# Patient Record
Sex: Female | Born: 1950
Health system: Southern US, Community
[De-identification: ages and names within clinical notes are randomized; demographics above are authoritative.]

## PROBLEM LIST (undated history)

## (undated) DIAGNOSIS — M1990 Unspecified osteoarthritis, unspecified site: Secondary | ICD-10-CM

## (undated) HISTORY — DX: Unspecified osteoarthritis, unspecified site: M19.90

---

## 2004-09-28 ENCOUNTER — Ambulatory Visit: Payer: Self-pay | Admitting: Internal Medicine

## 2004-09-28 ENCOUNTER — Other Ambulatory Visit: Admission: RE | Admit: 2004-09-28 | Discharge: 2004-09-28 | Payer: Self-pay | Admitting: Internal Medicine

## 2004-09-28 LAB — CONVERTED CEMR LAB: Pap Smear: NORMAL

## 2004-10-10 ENCOUNTER — Ambulatory Visit: Payer: Self-pay | Admitting: Internal Medicine

## 2004-11-17 ENCOUNTER — Ambulatory Visit: Payer: Self-pay | Admitting: Internal Medicine

## 2006-08-26 ENCOUNTER — Encounter: Payer: Self-pay | Admitting: Internal Medicine

## 2006-08-29 ENCOUNTER — Ambulatory Visit: Payer: Self-pay | Admitting: Internal Medicine

## 2006-08-29 ENCOUNTER — Other Ambulatory Visit: Admission: RE | Admit: 2006-08-29 | Discharge: 2006-08-29 | Payer: Self-pay | Admitting: Internal Medicine

## 2006-08-29 ENCOUNTER — Encounter: Payer: Self-pay | Admitting: Internal Medicine

## 2006-08-29 DIAGNOSIS — M19042 Primary osteoarthritis, left hand: Secondary | ICD-10-CM

## 2006-08-29 DIAGNOSIS — M19041 Primary osteoarthritis, right hand: Secondary | ICD-10-CM

## 2006-09-05 ENCOUNTER — Encounter (INDEPENDENT_AMBULATORY_CARE_PROVIDER_SITE_OTHER): Payer: Self-pay | Admitting: *Deleted

## 2006-09-11 ENCOUNTER — Ambulatory Visit: Payer: Self-pay | Admitting: Internal Medicine

## 2006-09-11 ENCOUNTER — Encounter: Payer: Self-pay | Admitting: Internal Medicine

## 2006-09-16 ENCOUNTER — Encounter (INDEPENDENT_AMBULATORY_CARE_PROVIDER_SITE_OTHER): Payer: Self-pay | Admitting: *Deleted

## 2006-10-21 ENCOUNTER — Encounter: Payer: Self-pay | Admitting: Internal Medicine

## 2006-12-06 ENCOUNTER — Encounter: Payer: Self-pay | Admitting: Internal Medicine

## 2006-12-06 ENCOUNTER — Ambulatory Visit: Payer: Self-pay | Admitting: Unknown Physician Specialty

## 2006-12-06 LAB — HM COLONOSCOPY

## 2009-08-26 ENCOUNTER — Ambulatory Visit: Payer: Self-pay | Admitting: Internal Medicine

## 2009-08-26 ENCOUNTER — Other Ambulatory Visit: Admission: RE | Admit: 2009-08-26 | Discharge: 2009-08-26 | Payer: Self-pay | Admitting: Internal Medicine

## 2009-08-26 DIAGNOSIS — J019 Acute sinusitis, unspecified: Secondary | ICD-10-CM

## 2009-08-29 LAB — CONVERTED CEMR LAB
Alkaline Phosphatase: 91 units/L (ref 39–117)
BUN: 16 mg/dL (ref 6–23)
Basophils Relative: 0.8 % (ref 0.0–3.0)
Bilirubin, Direct: 0.1 mg/dL (ref 0.0–0.3)
CO2: 29 meq/L (ref 19–32)
Calcium: 9.2 mg/dL (ref 8.4–10.5)
Eosinophils Absolute: 0.1 10*3/uL (ref 0.0–0.7)
Eosinophils Relative: 1.1 % (ref 0.0–5.0)
Lymphocytes Relative: 26.2 % (ref 12.0–46.0)
MCHC: 34.1 g/dL (ref 30.0–36.0)
Neutrophils Relative %: 64.8 % (ref 43.0–77.0)
Phosphorus: 3.3 mg/dL (ref 2.3–4.6)
Platelets: 232 10*3/uL (ref 150.0–400.0)
Potassium: 4.2 meq/L (ref 3.5–5.1)
RBC: 4.09 M/uL (ref 3.87–5.11)
Sodium: 142 meq/L (ref 135–145)
Total Bilirubin: 0.6 mg/dL (ref 0.3–1.2)
Total Protein: 7.3 g/dL (ref 6.0–8.3)
WBC: 7.5 10*3/uL (ref 4.5–10.5)

## 2009-08-30 ENCOUNTER — Encounter: Payer: Self-pay | Admitting: Internal Medicine

## 2009-09-01 ENCOUNTER — Encounter: Payer: Self-pay | Admitting: Internal Medicine

## 2009-09-01 ENCOUNTER — Ambulatory Visit: Payer: Self-pay | Admitting: Internal Medicine

## 2009-09-05 ENCOUNTER — Encounter: Payer: Self-pay | Admitting: Internal Medicine

## 2009-09-05 LAB — HM MAMMOGRAPHY: HM Mammogram: NORMAL

## 2010-04-18 NOTE — Assessment & Plan Note (Signed)
Summary: cpx with pap/rbh   Vital Signs:  Patient profile:   60 year old female Height:      61 inches Weight:      122 pounds BMI:     23.14 Temp:     98.5 degrees F oral Pulse rate:   60 / minute Pulse rhythm:   regular BP sitting:   120 / 62  (left arm) Cuff size:   regular  Vitals Entered By: Mervin Hack CMA Duncan Dull) (August 26, 2009 8:41 AM) CC: adult physical with pap   History of Present Illness: Has had some sinus problems over the past month sore throat and ear pain takes benedryl and sudafed with some help Thinks it may be related to cold air blowing at work Had fever and chills several weeks ago seems to feel better then worsens again    Allergies: 1)  Penicillin V Potassium (Penicillin V Potassium)  Past History:  Past medical, surgical, family and social histories (including risk factors) reviewed for relevance to current acute and chronic problems.  Past Medical History: Reviewed history from 08/29/2006 and no changes required.  Osteoarthritis  Past Surgical History: Reviewed history from 08/26/2006 and no changes required. vaginal delivery x 2  Family History: Reviewed history from 08/26/2006 and no changes required. Father: Died with lung cancer Mother: Died at age 58 heart  Siblings: 1 brother drinks  2 sisters ETOH- parents DM- ? Mat GM Breast cancer- Mat. aunt  Social History: Reviewed history from 08/29/2006 and no changes required. 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 Eyes:  Denies double vision and vision loss-1 eye. ENT:  Complains of sinus pressure and sore throat; denies decreased hearing and ringing in ears; teeth okay---regular with dentist. CV:  Denies chest pain or discomfort, difficulty breathing at  night, difficulty breathing while lying down, fainting, lightheadness, palpitations, and shortness of breath with exertion. Resp:  Complains of cough; denies shortness of breath; some cough with sinus stuff in past month. GI:  Denies abdominal pain, bloody stools, change in bowel habits, dark tarry stools, indigestion, nausea, and vomiting. GU:  Denies dysuria and incontinence; no sexual problems no self breast exam. MS:  Complains of joint pain; denies joint swelling; mild joint pains--no meds. Derm:  Denies lesion(s) and rash. Neuro:  Complains of headaches and numbness; denies tingling and weakness; occ numbness in arms at night---gets better quickly better with "side sleeper" thing Occ mild headaches. Psych:  Denies anxiety and depression. Heme:  Denies abnormal bruising and enlarge lymph nodes. Allergy:  Denies seasonal allergies and sneezing.  Physical Exam  General:  alert and normal appearance.   Head:  no frontal or maxillary tenderness Eyes:  pupils equal, pupils round, pupils reactive to light, and no optic disk abnormalities.   Ears:  R ear normal and L ear normal.   Nose:  moderate congestion mild inflammation Mouth:  no erythema, no exudates, and no lesions.   Neck:  supple, no masses, no thyromegaly, and no cervical lymphadenopathy.   Breasts:  skin/areolae normal, no masses, no abnormal thickening, no tenderness, and no adenopathy.   Lungs:  normal respiratory effort and normal breath sounds.   Heart:  normal rate, regular rhythm, no murmur, and no gallop.   Abdomen:  soft, non-tender, and no masses.   Genitalia:  normal introitus, no external lesions, no vaginal discharge, mucosa pink and moist, no vaginal or cervical lesions, no friaility or hemorrhage, normal uterus size and position, and no adnexal masses or tenderness.  Uterus retroverted Msk:  no joint tenderness and no joint swelling.   Pulses:  normal in feet Extremities:  no edema Neurologic:  alert & oriented  X3, strength normal in all extremities, and gait normal.   Skin:  no rashes and no suspicious lesions.   Psych:  normally interactive, good eye contact, not anxious appearing, and not depressed appearing.     Impression & Recommendations:  Problem # 1:  HEALTH MAINTENANCE EXAM (ICD-V70.0) Assessment Comment Only healthy counselling done due for mammo  check glucose  Problem # 2:  SINUSITIS - ACUTE-NOS (ICD-461.9) Assessment: New  seems like mild infection will treat with z-pak (pcn allergic)  Her updated medication list for this problem includes:    Azithromycin 250 Mg Tabs (Azithromycin) .Marland Kitchen... 2 tabs today then 1 tab daily for the next 4 days for sinus infection  Orders: TLB-CBC Platelet - w/Differential (85025-CBCD) TLB-Hepatic/Liver Function Pnl (80076-HEPATIC) TLB-TSH (Thyroid Stimulating Hormone) (84443-TSH) TLB-Renal Function Panel (80069-RENAL) Venipuncture ([address])  Problem # 3:  OSTEOARTHRITIS (ICD-715.90) Assessment: Unchanged mild  no meds needed  Complete Medication List: 1)  Azithromycin 250 Mg Tabs (Azithromycin) .... 2 tabs today then 1 tab daily for the next 4 days for sinus infection  Other Orders: Radiology Referral (Radiology)  Patient Instructions: 1)  Please schedule a follow-up appointment in 1 year.  2)  Schedule your mammogram.  Prescriptions: AZITHROMYCIN 250 MG TABS (AZITHROMYCIN) 2 tabs today then 1 tab daily for the next 4 days for sinus infection  #6 x 0   Entered and Authorized by:   Cindee Salt MD   Signed by:   Cindee Salt MD on 08/26/2009   Method used:   Electronically to        [facility]  [address] [phone]* (retail)       [address]       [place], [place]  [address]       Ph: [phone]       Fax: [phone]   RxID:   [id number]   Prior Medications: Current Allergies (reviewed today): PENICILLIN V POTASSIUM (PENICILLIN V POTASSIUM)  Appended Document: Orders Update    Clinical Lists  Changes  Orders: Added new Service order of Est. Patient Level III (02725) - Signed

## 2010-04-18 NOTE — Letter (Signed)
Summary: Results Follow up Letter  [place] [facility]  [address] [place], [place] [address]   Phone: [phone]  Fax: [phone]    08/30/2009 MRN: [id number]  [facility] [address] [place], [place]  [address]  Dear Jill Yang,  The following are the results of your recent test(s):  Test         Result    Pap Smear:        Normal __X___  Not Normal _____ Comments: Pap is normal ______________________________________________________ Cholesterol: LDL(Bad cholesterol):         Your goal is less than:         HDL (Good cholesterol):       Your goal is more than: Comments:  ______________________________________________________ Mammogram:        Normal _____  Not Normal _____ Comments:  ___________________________________________________________________ Hemoccult:        Normal _____  Not normal _______ Comments:    _____________________________________________________________________ Other Tests:    We routinely do not discuss normal results over the telephone.  If you desire a copy of the results, or you have any questions about this information we can discuss them at your next office visit.   Sincerely,      Tillman Abide, MD

## 2010-04-18 NOTE — Letter (Signed)
Summary: Results Follow up Letter  [place] at [facility]  [address] [place], [place] [address]   Phone: [phone]  Fax: [phone]    09/05/2009 MRN: [id number]  [facility] [address] [place], [place]  [address]  Dear Ms. Mcfann,  The following are the results of your recent test(s):  Test         Result    Pap Smear:        Normal _____  Not Normal _____ Comments: ______________________________________________________ Cholesterol: LDL(Bad cholesterol):         Your goal is less than:         HDL (Good cholesterol):       Your goal is more than: Comments:  ______________________________________________________ Mammogram:        Normal __X___  Not Normal _____ Comments: mammo is fine, repeat recommended in 1-2 years.  ___________________________________________________________________ Hemoccult:        Normal _____  Not normal _______ Comments:    _____________________________________________________________________ Other Tests:    We routinely do not discuss normal results over the telephone.  If you desire a copy of the results, or you have any questions about this information we can discuss them at your next office visit.   Sincerely,      Tillman Abide, MD

## 2011-01-12 ENCOUNTER — Encounter: Payer: Self-pay | Admitting: Internal Medicine

## 2011-01-15 ENCOUNTER — Ambulatory Visit (INDEPENDENT_AMBULATORY_CARE_PROVIDER_SITE_OTHER): Payer: Self-pay | Admitting: Internal Medicine

## 2011-01-15 ENCOUNTER — Encounter: Payer: Self-pay | Admitting: Internal Medicine

## 2011-01-15 VITALS — BP 144/73 | HR 69 | Temp 98.6°F | Ht 61.0 in | Wt 124.0 lb

## 2011-01-15 DIAGNOSIS — M1990 Unspecified osteoarthritis, unspecified site: Secondary | ICD-10-CM

## 2011-01-15 DIAGNOSIS — Z1231 Encounter for screening mammogram for malignant neoplasm of breast: Secondary | ICD-10-CM

## 2011-01-15 DIAGNOSIS — Z0000 Encounter for general adult medical examination without abnormal findings: Secondary | ICD-10-CM

## 2011-01-15 NOTE — Assessment & Plan Note (Signed)
Various pains Not reasonable for work to expect her to push cart weighing over 700# She will review with supervisors  Gets relief with [employer]

## 2011-01-15 NOTE — Progress Notes (Signed)
Subjective:    Patient ID: Jill Yang, female    DOB: 04-05-1950, 60 y.o.   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 Feels that will worsen her condition---has to push and pull heavy cart and turn crank Doesn't feel she could handle the heaviness of the instruments Will have to work 2nd shift at times also  Nothing new in social or family histories  No current outpatient prescriptions on file prior to visit.    Allergies  Allergen Reactions  . Penicillins     Past Medical History  Diagnosis Date  . Arthritis     No past surgical history on file.  Family History  Problem Relation Age of Onset  . Alcohol abuse Mother   . Alcohol abuse Father   . Diabetes Maternal Aunt   . Cancer Maternal Aunt     breast cancer    History   Social History  . Marital Status: Married    Spouse Name: N/A    Number of Children: 2  . Years of Education: N/A   Occupational History  . 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 Smoking status: Never Smoker   . Smokeless tobacco: Never Used  . Alcohol Use: No  . Drug Use: No  . Sexually Active: Not on file   Other Topics Concern  . Not on file   Social History Narrative  . No narrative on file   Review of Systems  Constitutional: Negative for fatigue and unexpected weight change.       Wears seat belt  HENT: Positive for congestion and rhinorrhea. Negative for hearing loss, dental problem and tinnitus.        Occ allergy symptoms---does okay with OTC antihistamines  Eyes: Negative for visual disturbance.       No diplopia or unilateral vision loss  Respiratory: Negative for cough, chest tightness and shortness of breath.   Cardiovascular: Negative for chest pain, palpitations and leg swelling.  Gastrointestinal: Positive for anal  bleeding. Negative for nausea, vomiting, abdominal pain, constipation and blood in stool.       No heartburn occ blood on toilet paper from hemorrhoids  Genitourinary: Negative for dysuria, frequency and difficulty urinating.       No incontinence No sexual problems  Musculoskeletal: Positive for back pain and arthralgias. Negative for joint swelling.       Aleve helps pain-- at most 4 per days  Skin: Negative for color change and rash.  Neurological: Positive for numbness and headaches. Negative for dizziness, syncope and light-headedness.       Occ headaches--aleve aborts occ left arm numbness  Hematological: Negative for adenopathy. Does not bruise/bleed easily.  Psychiatric/Behavioral: Positive for sleep disturbance. Negative for dysphoric mood. The patient is not nervous/anxious.        Some sleep problems from right arm pain and occ numbness       Objective:   Physical Exam  Constitutional: She is oriented to person, place, and time. She appears well-developed and well-nourished. No distress.  HENT:  Head: Normocephalic and atraumatic.  Right Ear: External ear normal.  Left Ear: External ear normal.  Mouth/Throat: Oropharynx is clear and moist. No oropharyngeal exudate.       TMs normal  Eyes: Conjunctivae and EOM are normal. Pupils are equal, round, and reactive to  light.       Fundi benign  Neck: Normal range of motion. Neck supple. No thyromegaly present.  Cardiovascular: Normal rate, regular rhythm, normal heart sounds and intact distal pulses.  Exam reveals no gallop.   No murmur heard. Pulmonary/Chest: Effort normal and breath sounds normal. No respiratory distress. She has no wheezes. She has no rales.  Abdominal: Soft. There is no tenderness.  Genitourinary:       Mild cystic changes in breasts R>L  Musculoskeletal: Normal range of motion. She exhibits no edema and no tenderness.  Lymphadenopathy:    She has no cervical adenopathy.    She has no axillary  adenopathy.  Neurological: She is alert and oriented to person, place, and time.  Skin: Skin is warm. No rash noted.  Psychiatric: She has a normal mood and affect. Her behavior is normal. Judgment and thought content normal.          Assessment & Plan:

## 2011-01-15 NOTE — Assessment & Plan Note (Signed)
Healthy Discussed fitness Will schedule mammo Check glucose

## 2011-01-16 LAB — GLUCOSE, RANDOM: Glucose, Bld: 104 mg/dL — ABNORMAL HIGH (ref 70–99)

## 2011-03-08 ENCOUNTER — Ambulatory Visit: Payer: Self-pay | Admitting: Internal Medicine

## 2011-03-12 ENCOUNTER — Encounter: Payer: Self-pay | Admitting: *Deleted

## 2014-07-09 ENCOUNTER — Ambulatory Visit (INDEPENDENT_AMBULATORY_CARE_PROVIDER_SITE_OTHER): Payer: 59 | Admitting: Internal Medicine

## 2014-07-09 ENCOUNTER — Encounter: Payer: Self-pay | Admitting: Internal Medicine

## 2014-07-09 ENCOUNTER — Other Ambulatory Visit (HOSPITAL_COMMUNITY)
Admission: RE | Admit: 2014-07-09 | Discharge: 2014-07-09 | Disposition: A | Discharge status: Home / Self Care | Payer: 59 | Source: Ambulatory Visit | Attending: Internal Medicine | Admitting: Internal Medicine

## 2014-07-09 ENCOUNTER — Encounter (INDEPENDENT_AMBULATORY_CARE_PROVIDER_SITE_OTHER): Payer: Self-pay

## 2014-07-09 VITALS — BP 138/70 | HR 65 | Temp 97.9°F | Ht 61.0 in | Wt 127.0 lb

## 2014-07-09 DIAGNOSIS — Z1151 Encounter for screening for human papillomavirus (HPV): Secondary | ICD-10-CM | POA: Insufficient documentation

## 2014-07-09 DIAGNOSIS — M19042 Primary osteoarthritis, left hand: Secondary | ICD-10-CM

## 2014-07-09 DIAGNOSIS — M19041 Primary osteoarthritis, right hand: Secondary | ICD-10-CM | POA: Diagnosis not present

## 2014-07-09 DIAGNOSIS — Z01419 Encounter for gynecological examination (general) (routine) without abnormal findings: Secondary | ICD-10-CM | POA: Insufficient documentation

## 2014-07-09 DIAGNOSIS — Z23 Encounter for immunization: Secondary | ICD-10-CM | POA: Diagnosis not present

## 2014-07-09 DIAGNOSIS — Z0000 Encounter for general adult medical examination without abnormal findings: Secondary | ICD-10-CM

## 2014-07-09 LAB — RENAL FUNCTION PANEL
Albumin: 4.3 g/dL (ref 3.5–5.2)
BUN: 18 mg/dL (ref 6–23)
CALCIUM: 9.3 mg/dL (ref 8.4–10.5)
CO2: 28 mEq/L (ref 19–32)
Chloride: 107 mEq/L (ref 96–112)
Creatinine, Ser: 0.71 mg/dL (ref 0.40–1.20)
GFR: 88.46 mL/min (ref >60.00)
Glucose, Bld: 98 mg/dL (ref 70–99)
PHOSPHORUS: 3.5 mg/dL (ref 2.3–4.6)
POTASSIUM: 3.9 meq/L (ref 3.5–5.1)
SODIUM: 141 meq/L (ref 135–145)

## 2014-07-09 LAB — LIPID PANEL
Cholesterol: 219 mg/dL — ABNORMAL HIGH (ref 0–200)
HDL: 67 mg/dL (ref >39.00)
LDL Cholesterol: 136 mg/dL — ABNORMAL HIGH (ref 0–99)
NONHDL: 152
TRIGLYCERIDES: 79 mg/dL (ref 0.0–149.0)
Total CHOL/HDL Ratio: 3
VLDL: 15.8 mg/dL (ref 0.0–40.0)

## 2014-07-09 NOTE — Assessment & Plan Note (Signed)
Healthy Colonoscopy due 2018 She will set up mammogram Tdap today Pap done today

## 2014-07-09 NOTE — Progress Notes (Signed)
Subjective:    Patient ID: Jill Yang, female    DOB: February 22, 1951, [age] y.o.   MRN: [id number]  HPI Here for physical No recent health issues  No recent mammogram Pap last 2011 Due to tetanus Still working--same job Nothing new in FH  No current outpatient prescriptions on file prior to visit.   No current facility-administered medications on file prior to visit.    Allergies  Allergen Reactions  . Penicillins     Past Medical History  Diagnosis Date  . Arthritis     No past surgical history on file.  Family History  Problem Relation Age of Onset  . Alcohol abuse Mother   . Alcohol abuse Father   . Diabetes Maternal Aunt   . Cancer Maternal Aunt     breast cancer    History   Social History  . Marital Status: Married    Spouse Name: N/A  . Number of Children: 2  . Years of Education: N/A   Occupational History  . 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 Smoking status: Never Smoker   . Smokeless tobacco: Never Used  . Alcohol Use: No  . Drug Use: No  . Sexual Activity: Not on file   Other Topics Concern  . Not on file   Social History Narrative   Review of Systems  Constitutional: Negative for fatigue and unexpected weight change.       Regular exercise Wears seat belt  HENT: Negative for dental problem, hearing loss, tinnitus and trouble swallowing.        Keeps up the dentist  Eyes: Negative for visual disturbance.       No diplopia or unilateral vision loss  Respiratory: Negative for cough, chest tightness and shortness of breath.   Cardiovascular: Negative for chest pain, palpitations and leg swelling.  Gastrointestinal: Negative for nausea, vomiting, abdominal pain, constipation and blood in stool.       No heartburn  Genitourinary: Negative for dysuria, frequency, hematuria, difficulty urinating and dyspareunia.  Musculoskeletal: Positive for back pain and arthralgias. Negative for  joint swelling.       Takes aleve prn  Skin: Negative for rash.       No suspicious lesions   Allergic/Immunologic: Positive for environmental allergies. Negative for immunocompromised state.       Rarely needs OTC meds  Neurological: Positive for numbness and headaches. Negative for dizziness, syncope and weakness.       Occ mild headache Some numbness in left arm at night--discussed splints  Hematological: Negative for adenopathy. Does not bruise/bleed easily.  Psychiatric/Behavioral: Negative for sleep disturbance and dysphoric mood. The patient is not nervous/anxious.        Objective:   Physical Exam  Constitutional: She is oriented to person, place, and time. She appears well-developed and well-nourished. No distress.  HENT:  Head: Normocephalic and atraumatic.  Right Ear: External ear normal.  Left Ear: External ear normal.  Mouth/Throat: Oropharynx is clear and moist. No oropharyngeal exudate.  Eyes: Conjunctivae and EOM are normal. Pupils are equal, round, and reactive to light.  Neck: Normal range of motion. Neck supple. No thyromegaly present.  Cardiovascular: Normal rate, regular rhythm, normal heart sounds and intact distal pulses.  Exam reveals no gallop.   No murmur heard. Pulmonary/Chest: Effort normal and breath sounds normal. No respiratory distress. She has no wheezes. She has no rales.  Abdominal: Soft. There is no tenderness.  Genitourinary:  Dense breasts but no concerning mass  Normal introitus Cervix normal--pap taken Bimanual negative  Musculoskeletal: She exhibits no edema or tenderness.  Lymphadenopathy:    She has no cervical adenopathy.  Neurological: She is alert and oriented to person, place, and time.  Skin: No rash noted. No erythema.  Psychiatric: She has a normal mood and affect. Her behavior is normal.          Assessment & Plan:

## 2014-07-09 NOTE — Patient Instructions (Signed)
Please set up your screening mammogram. 

## 2014-07-09 NOTE — Addendum Note (Signed)
Addended by: Sueanne MargaritaSMITH, Jinan Biggins L on: 07/09/2014 10:07 AM   Modules accepted: Orders

## 2014-07-09 NOTE — Assessment & Plan Note (Signed)
Mild Will check renal due to occasional aleve use

## 2014-07-09 NOTE — Progress Notes (Signed)
Pre visit review using our clinic review tool, if applicable. No additional management support is needed unless otherwise documented below in the visit note. 

## 2014-07-12 ENCOUNTER — Encounter: Payer: Self-pay | Admitting: *Deleted

## 2014-07-12 LAB — CYTOLOGY - PAP

## 2014-07-14 ENCOUNTER — Encounter: Payer: Self-pay | Admitting: *Deleted

## 2014-07-27 ENCOUNTER — Other Ambulatory Visit: Payer: Self-pay | Admitting: Internal Medicine

## 2014-07-27 DIAGNOSIS — Z1231 Encounter for screening mammogram for malignant neoplasm of breast: Secondary | ICD-10-CM

## 2014-07-29 ENCOUNTER — Ambulatory Visit: Payer: 59

## 2014-08-06 ENCOUNTER — Ambulatory Visit
Admission: RE | Admit: 2014-08-06 | Discharge: 2014-08-06 | Disposition: A | Discharge status: Home / Self Care | Payer: 59 | Source: Ambulatory Visit | Attending: Internal Medicine | Admitting: Internal Medicine

## 2014-08-06 DIAGNOSIS — Z1231 Encounter for screening mammogram for malignant neoplasm of breast: Secondary | ICD-10-CM | POA: Diagnosis not present

## 2014-08-09 ENCOUNTER — Other Ambulatory Visit: Payer: Self-pay | Admitting: Internal Medicine

## 2014-08-09 DIAGNOSIS — N63 Unspecified lump in unspecified breast: Secondary | ICD-10-CM

## 2014-08-09 DIAGNOSIS — R928 Other abnormal and inconclusive findings on diagnostic imaging of breast: Secondary | ICD-10-CM

## 2014-08-10 ENCOUNTER — Ambulatory Visit
Admission: RE | Admit: 2014-08-10 | Discharge: 2014-08-10 | Disposition: A | Discharge status: Home / Self Care | Payer: 59 | Source: Ambulatory Visit | Attending: Internal Medicine | Admitting: Internal Medicine

## 2014-08-10 DIAGNOSIS — R928 Other abnormal and inconclusive findings on diagnostic imaging of breast: Secondary | ICD-10-CM | POA: Insufficient documentation

## 2014-08-10 DIAGNOSIS — N63 Unspecified lump in unspecified breast: Secondary | ICD-10-CM

## 2014-08-11 ENCOUNTER — Encounter: Payer: Self-pay | Admitting: *Deleted

## 2014-09-22 ENCOUNTER — Telehealth: Payer: Self-pay | Admitting: *Deleted

## 2014-09-22 NOTE — Telephone Encounter (Signed)
Lm on pts vm regarding scheduling mammogram 

## 2016-05-07 ENCOUNTER — Other Ambulatory Visit: Payer: Self-pay | Admitting: Internal Medicine

## 2016-11-30 ENCOUNTER — Ambulatory Visit (INDEPENDENT_AMBULATORY_CARE_PROVIDER_SITE_OTHER): Payer: Medicare Other | Admitting: Internal Medicine

## 2016-11-30 ENCOUNTER — Encounter: Payer: Self-pay | Admitting: Internal Medicine

## 2016-11-30 VITALS — BP 122/60 | HR 69 | Temp 98.1°F | Ht 60.0 in | Wt 125.0 lb

## 2016-11-30 DIAGNOSIS — Z0000 Encounter for general adult medical examination without abnormal findings: Secondary | ICD-10-CM

## 2016-11-30 DIAGNOSIS — M19041 Primary osteoarthritis, right hand: Secondary | ICD-10-CM | POA: Diagnosis not present

## 2016-11-30 DIAGNOSIS — M19042 Primary osteoarthritis, left hand: Secondary | ICD-10-CM | POA: Diagnosis not present

## 2016-11-30 DIAGNOSIS — Z7189 Other specified counseling: Secondary | ICD-10-CM | POA: Diagnosis not present

## 2016-11-30 DIAGNOSIS — E2839 Other primary ovarian failure: Secondary | ICD-10-CM

## 2016-11-30 LAB — CBC WITH DIFFERENTIAL/PLATELET
Basophils Absolute: 0.1 10*3/uL (ref 0.0–0.1)
Basophils Relative: 0.8 % (ref 0.0–3.0)
EOS PCT: 0.8 % (ref 0.0–5.0)
Eosinophils Absolute: 0.1 10*3/uL (ref 0.0–0.7)
HCT: 41.5 % (ref 36.0–46.0)
Hemoglobin: 13.7 g/dL (ref 12.0–15.0)
LYMPHS ABS: 1.5 10*3/uL (ref 0.7–4.0)
Lymphocytes Relative: 20.4 % (ref 12.0–46.0)
MCHC: 33.1 g/dL (ref 30.0–36.0)
MCV: 91.9 fl (ref 78.0–100.0)
MONO ABS: 0.4 10*3/uL (ref 0.1–1.0)
Monocytes Relative: 5 % (ref 3.0–12.0)
NEUTROS ABS: 5.5 10*3/uL (ref 1.4–7.7)
NEUTROS PCT: 73 % (ref 43.0–77.0)
PLATELETS: 186 10*3/uL (ref 150.0–400.0)
RBC: 4.51 Mil/uL (ref 3.87–5.11)
RDW: 12.8 % (ref 11.5–15.5)
WBC: 7.5 10*3/uL (ref 4.0–10.5)

## 2016-11-30 LAB — RENAL FUNCTION PANEL
ALBUMIN: 4.4 g/dL (ref 3.5–5.2)
BUN: 16 mg/dL (ref 6–23)
CALCIUM: 9.6 mg/dL (ref 8.4–10.5)
CO2: 28 mEq/L (ref 19–32)
CREATININE: 0.92 mg/dL (ref 0.40–1.20)
Chloride: 105 mEq/L (ref 96–112)
GFR: 65.1 mL/min (ref >60.00)
GLUCOSE: 107 mg/dL — AB (ref 70–99)
POTASSIUM: 4.3 meq/L (ref 3.5–5.1)
Phosphorus: 3.4 mg/dL (ref 2.3–4.6)
SODIUM: 141 meq/L (ref 135–145)

## 2016-11-30 NOTE — Progress Notes (Signed)
   Subjective:    Patient ID: Jill Yang, female    DOB: [birth year], 66 y.o.   MRN: [id number]  HPI Here for Welcome to Medicare visit and follow up of chronic health conditions Reviewed form and advanced directives Reviewed other doctors No alcohol or tobacco Exercises regularly--walking and weight training No falls No depression or anhedonia Vision is fine---needs to set up with dentist Hearing is good.  Testing here 25dB at all frequencies on right,  and  on left. 40dB for 1000 and 4000 on left Independent with instrumental ADLs No apparent memory problems  Mild arthritis in hands Was worse at work when they kept it cold Worse in winter Uses aleve prn  No current outpatient prescriptions on file prior to visit.   No current facility-administered medications on file prior to visit.     Allergies  Allergen Reactions  . Penicillins     Past Medical History:  Diagnosis Date  . Arthritis     No past surgical history on file.  Family History  Problem Relation Age of Onset  . Alcohol abuse Mother   . Alcohol abuse Father   . Diabetes Maternal Aunt   . Breast cancer Maternal Aunt   . Cancer Maternal Aunt        breast cancer    Social History   Social History  . Marital status: Married    Spouse name: N/A  . Number of children: 2  . Years of education: N/A   Occupational History  . 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 Smoking status: Never Smoker  . Smokeless tobacco: Never Used  . Alcohol use No  . Drug use: No  . Sexual activity: Not on file   Other Topics Concern  . Not on file   Social History Narrative   No living will or health care POA   Husband, then sons, would be decision makers   Would accept resuscitation   Not sure about tube feeds   Review of Systems Overdue with dentist--no problems. Will set up Sleeps fairly well. No daytime somnolence. Appetite is good Weight  is stable Wears seat belt No trouble with bowels. No blood Voids fine. No incontinence No sexual problems. No other back or joint pains No chest pain, SOB or palpitations No dizziness or syncope Rare heartburn--- tums helps. No dysphagia No skin rash or suspicious lesions    Objective:   Physical Exam  Constitutional: She is oriented to person, place, and time. She appears well-developed. No distress.  HENT:  Mouth/Throat: Oropharynx is clear and moist. No oropharyngeal exudate.  Neck: No thyromegaly present.  Cardiovascular: Normal rate, regular rhythm, normal heart sounds and intact distal pulses.  Exam reveals no gallop.   No murmur heard. Pulmonary/Chest: Effort normal and breath sounds normal. No respiratory distress. She has no wheezes. She has no rales.  Abdominal: Soft. There is no tenderness.  Musculoskeletal: She exhibits no edema or tenderness.  Lymphadenopathy:    She has no cervical adenopathy.  Neurological: She is alert and oriented to person, place, and time.  President--- "[name], Obama, Bush" [phone]   I'm not good at math D-l-[id number] Recall 3/3  Skin: No rash noted. No erythema.  Psychiatric: She has a normal mood and affect. Her behavior is normal.          Assessment & Plan:
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
## 2016-11-30 NOTE — Patient Instructions (Addendum)
Please set up your colonoscopy. Please set up your screening mammogram.  DASH Eating Plan DASH stands for "Dietary Approaches to Stop Hypertension." The DASH eating plan is a healthy eating plan that has been shown to reduce high blood pressure (hypertension). It may also reduce your risk for type 2 diabetes, heart disease, and stroke. The DASH eating plan may also help with weight loss. What are tips for following this plan? General guidelines  Avoid eating more than 2,300 mg (milligrams) of salt (sodium) a day. If you have hypertension, you may need to reduce your sodium intake to 1,500 mg a day.  Limit alcohol intake to no more than 1 drink a day for nonpregnant women and 2 drinks a day for men. One drink equals 12 oz of beer, 5 oz of wine, or 1 oz of hard liquor.  Work with your health care provider to maintain a healthy body weight or to lose weight. Ask what an ideal weight is for you.  Get at least 30 minutes of exercise that causes your heart to beat faster (aerobic exercise) most days of the week. Activities may include walking, swimming, or biking.  Work with your health care provider or diet and nutrition specialist (dietitian) to adjust your eating plan to your individual calorie needs. Reading food labels  Check food labels for the amount of sodium per serving. Choose foods with less than 5 percent of the Daily Value of sodium. Generally, foods with less than 300 mg of sodium per serving fit into this eating plan.  To find whole grains, look for the word "whole" as the first word in the ingredient list. Shopping  Buy products labeled as "low-sodium" or "no salt added."  Buy fresh foods. Avoid canned foods and premade or frozen meals. Cooking  Avoid adding salt when cooking. Use salt-free seasonings or herbs instead of table salt or sea salt. Check with your health care provider or pharmacist before using salt substitutes.  Do not fry foods. Cook foods using healthy  methods such as baking, boiling, grilling, and broiling instead.  Cook with heart-healthy oils, such as olive, canola, soybean, or sunflower oil. Meal planning   Eat a balanced diet that includes: ? 5 or more servings of fruits and vegetables each day. At each meal, try to fill half of your plate with fruits and vegetables. ? Up to 6-8 servings of whole grains each day. ? Less than 6 oz of lean meat, poultry, or fish each day. A 3-oz serving of meat is about the same size as a deck of cards. One egg equals 1 oz. ? 2 servings of low-fat dairy each day. ? A serving of nuts, seeds, or beans 5 times each week. ? Heart-healthy fats. Healthy fats called Omega-3 fatty acids are found in foods such as flaxseeds and coldwater fish, like sardines, salmon, and mackerel.  Limit how much you eat of the following: ? Canned or prepackaged foods. ? Food that is high in trans fat, such as fried foods. ? Food that is high in saturated fat, such as fatty meat. ? Sweets, desserts, sugary drinks, and other foods with added sugar. ? Full-fat dairy products.  Do not salt foods before eating.  Try to eat at least 2 vegetarian meals each week.  Eat more home-cooked food and less restaurant, buffet, and fast food.  When eating at a restaurant, ask that your food be prepared with less salt or no salt, if possible. What foods are recommended? The items listed  may not be a complete list. Talk with your dietitian about what dietary choices are best for you. Grains Whole-grain or whole-wheat bread. Whole-grain or whole-wheat pasta. Brown rice. Jill Yang. Bulgur. Whole-grain and low-sodium cereals. Pita bread. Low-fat, low-sodium crackers. Whole-wheat flour tortillas. Vegetables Fresh or frozen vegetables (raw, steamed, roasted, or grilled). Low-sodium or reduced-sodium tomato and vegetable juice. Low-sodium or reduced-sodium tomato sauce and tomato paste. Low-sodium or reduced-sodium canned  vegetables. Fruits All fresh, dried, or frozen fruit. Canned fruit in natural juice (without added sugar). Meat and other protein foods Skinless chicken or [place]. Ground chicken or [place]. Pork with fat trimmed off. Fish and seafood. Egg whites. Dried beans, peas, or lentils. Unsalted nuts, nut butters, and seeds. Unsalted canned beans. Lean cuts of beef with fat trimmed off. Low-sodium, lean deli meat. Dairy Low-fat (1%) or fat-free (skim) milk. Fat-free, low-fat, or reduced-fat cheeses. Nonfat, low-sodium ricotta or cottage cheese. Low-fat or nonfat yogurt. Low-fat, low-sodium cheese. Fats and oils Soft margarine without trans fats. Vegetable oil. Low-fat, reduced-fat, or light mayonnaise and salad dressings (reduced-sodium). Canola, safflower, olive, soybean, and sunflower oils. Avocado. Seasoning and other foods Herbs. Spices. Seasoning mixes without salt. Unsalted popcorn and pretzels. Fat-free sweets. What foods are not recommended? The items listed may not be a complete list. Talk with your dietitian about what dietary choices are best for you. Grains Baked goods made with fat, such as croissants, muffins, or some breads. Dry pasta or rice meal packs. Vegetables Creamed or fried vegetables. Vegetables in a cheese sauce. Regular canned vegetables (not low-sodium or reduced-sodium). Regular canned tomato sauce and paste (not low-sodium or reduced-sodium). Regular tomato and vegetable juice (not low-sodium or reduced-sodium). Jill Yang. Olives. Fruits Canned fruit in a light or heavy syrup. Fried fruit. Fruit in cream or butter sauce. Meat and other protein foods Fatty cuts of meat. Ribs. Fried meat. Jill Yang. Sausage. Bologna and other processed lunch meats. Salami. Fatback. Hotdogs. Bratwurst. Salted nuts and seeds. Canned beans with added salt. Canned or smoked fish. Whole eggs or egg yolks. Chicken or [place] with skin. Dairy Whole or 2% milk, cream, and half-and-half. Whole or full-fat  cream cheese. Whole-fat or sweetened yogurt. Full-fat cheese. Nondairy creamers. Whipped toppings. Processed cheese and cheese spreads. Fats and oils Butter. Stick margarine. Lard. Shortening. Ghee. Bacon fat. Tropical oils, such as coconut, palm kernel, or palm oil. Seasoning and other foods Salted popcorn and pretzels. Onion salt, garlic salt, seasoned salt, table salt, and sea salt. Worcestershire sauce. Tartar sauce. Barbecue sauce. Teriyaki sauce. Soy sauce, including reduced-sodium. Steak sauce. Canned and packaged gravies. Fish sauce. Oyster sauce. Cocktail sauce. Horseradish that you find on the shelf. Ketchup. Mustard. Meat flavorings and tenderizers. Bouillon cubes. Hot sauce and Tabasco sauce. Premade or packaged marinades. Premade or packaged taco seasonings. Relishes. Regular salad dressings. Where to find more information:  National Heart, Lung, and [place]: https://wilson-eaton.com/  American Heart Association: www.heart.org Summary  The DASH eating plan is a healthy eating plan that has been shown to reduce high blood pressure (hypertension). It may also reduce your risk for type 2 diabetes, heart disease, and stroke.  With the DASH eating plan, you should limit salt (sodium) intake to 2,300 mg a day. If you have hypertension, you may need to reduce your sodium intake to 1,500 mg a day.  When on the DASH eating plan, aim to eat more fresh fruits and vegetables, whole grains, lean proteins, low-fat dairy, and heart-healthy fats.  Work with your health care provider or diet  and nutrition specialist (dietitian) to adjust your eating plan to your individual calorie needs. This information is not intended to replace advice given to you by your health care provider. Make sure you discuss any questions you have with your health care provider. Document Released: 02/22/2011 Document Revised: 02/27/2016 Document Reviewed: 02/27/2016 Elsevier Interactive Patient Education  2017 [employer].

## 2016-11-30 NOTE — Assessment & Plan Note (Signed)
See social history Blank forms given 

## 2016-11-30 NOTE — Addendum Note (Signed)
Addended by: Tillman Abide I on: 11/30/2016 10:18 AM   Modules accepted: Orders

## 2016-11-30 NOTE — Assessment & Plan Note (Signed)
Mild Uses aleve for pain as needed---so will check CBC and renal

## 2016-12-03 ENCOUNTER — Encounter: Payer: Self-pay | Admitting: *Deleted

## 2016-12-06 ENCOUNTER — Other Ambulatory Visit: Payer: Self-pay | Admitting: Internal Medicine

## 2016-12-06 DIAGNOSIS — Z1231 Encounter for screening mammogram for malignant neoplasm of breast: Secondary | ICD-10-CM

## 2017-01-15 ENCOUNTER — Ambulatory Visit
Admission: RE | Admit: 2017-01-15 | Discharge: 2017-01-15 | Disposition: A | Discharge status: Home / Self Care | Payer: Medicare Other | Source: Ambulatory Visit | Attending: Internal Medicine | Admitting: Internal Medicine

## 2017-01-15 DIAGNOSIS — M810 Age-related osteoporosis without current pathological fracture: Secondary | ICD-10-CM | POA: Insufficient documentation

## 2017-01-15 DIAGNOSIS — Z1231 Encounter for screening mammogram for malignant neoplasm of breast: Secondary | ICD-10-CM | POA: Diagnosis not present

## 2017-01-15 DIAGNOSIS — E2839 Other primary ovarian failure: Secondary | ICD-10-CM | POA: Insufficient documentation

## 2017-01-15 DIAGNOSIS — Z780 Asymptomatic menopausal state: Secondary | ICD-10-CM | POA: Diagnosis not present

## 2017-01-15 DIAGNOSIS — M85852 Other specified disorders of bone density and structure, left thigh: Secondary | ICD-10-CM | POA: Insufficient documentation

## 2017-03-01 DIAGNOSIS — Z1211 Encounter for screening for malignant neoplasm of colon: Secondary | ICD-10-CM | POA: Diagnosis not present

## 2017-04-08 LAB — HM COLONOSCOPY

## 2017-04-09 DIAGNOSIS — Z1211 Encounter for screening for malignant neoplasm of colon: Secondary | ICD-10-CM | POA: Diagnosis not present

## 2017-04-09 DIAGNOSIS — K640 First degree hemorrhoids: Secondary | ICD-10-CM | POA: Diagnosis not present

## 2017-04-09 DIAGNOSIS — K648 Other hemorrhoids: Secondary | ICD-10-CM | POA: Diagnosis not present

## 2017-04-09 DIAGNOSIS — K5730 Diverticulosis of large intestine without perforation or abscess without bleeding: Secondary | ICD-10-CM | POA: Diagnosis not present

## 2017-12-02 ENCOUNTER — Ambulatory Visit (INDEPENDENT_AMBULATORY_CARE_PROVIDER_SITE_OTHER): Payer: Medicare Other | Admitting: Internal Medicine

## 2017-12-02 ENCOUNTER — Encounter: Payer: Self-pay | Admitting: Internal Medicine

## 2017-12-02 VITALS — BP 138/70 | HR 67 | Temp 98.1°F | Ht 60.75 in | Wt 124.0 lb

## 2017-12-02 DIAGNOSIS — Z0000 Encounter for general adult medical examination without abnormal findings: Secondary | ICD-10-CM

## 2017-12-02 DIAGNOSIS — M810 Age-related osteoporosis without current pathological fracture: Secondary | ICD-10-CM

## 2017-12-02 DIAGNOSIS — Z23 Encounter for immunization: Secondary | ICD-10-CM

## 2017-12-02 DIAGNOSIS — Z7189 Other specified counseling: Secondary | ICD-10-CM

## 2017-12-02 LAB — COMPREHENSIVE METABOLIC PANEL
ALT: 8 U/L (ref 0–35)
AST: 11 U/L (ref 0–37)
Albumin: 4.4 g/dL (ref 3.5–5.2)
Alkaline Phosphatase: 85 U/L (ref 39–117)
BUN: 17 mg/dL (ref 6–23)
CHLORIDE: 106 meq/L (ref 96–112)
CO2: 29 meq/L (ref 19–32)
Calcium: 9.2 mg/dL (ref 8.4–10.5)
Creatinine, Ser: 0.77 mg/dL (ref 0.40–1.20)
GFR: 79.7 mL/min (ref >60.00)
GLUCOSE: 100 mg/dL — AB (ref 70–99)
POTASSIUM: 4.4 meq/L (ref 3.5–5.1)
Sodium: 140 mEq/L (ref 135–145)
Total Bilirubin: 1.1 mg/dL (ref 0.2–1.2)
Total Protein: 7 g/dL (ref 6.0–8.3)

## 2017-12-02 LAB — T4, FREE: Free T4: 1.01 ng/dL (ref 0.60–1.60)

## 2017-12-02 LAB — CBC
HEMATOCRIT: 39.9 % (ref 36.0–46.0)
Hemoglobin: 13.4 g/dL (ref 12.0–15.0)
MCHC: 33.7 g/dL (ref 30.0–36.0)
MCV: 90.3 fl (ref 78.0–100.0)
Platelets: 186 10*3/uL (ref 150.0–400.0)
RBC: 4.42 Mil/uL (ref 3.87–5.11)
RDW: 12.5 % (ref 11.5–15.5)
WBC: 6.7 10*3/uL (ref 4.0–10.5)

## 2017-12-02 NOTE — Assessment & Plan Note (Signed)
See social history 

## 2017-12-02 NOTE — Addendum Note (Signed)
Addended by: Eual FinesBRIDGES, Zabian Swayne P on: 12/02/2017 12:25 PM   Modules accepted: Orders

## 2017-12-02 NOTE — Assessment & Plan Note (Signed)
On calcium/vitamin D and weight bearing exercise Will recheck in 1-2 years Consider bisphosphonate if worsening

## 2017-12-02 NOTE — Progress Notes (Signed)
Subjective:    Patient ID: Jill Yang, female    DOB: 1951-03-06, 67 y.o.   MRN: [id number]  HPI Here for Medicare wellness visit and follow up of chronic health conditions Reviewed form and advanced directives Reviewed other doctors No alcohol or tobacco Vision and hearing are fine No falls No depression or anhedonia Independent with instrumental ADLs No memory issues  Discussed the DEXA On calcium chews and vitamin D Regular exercise  Current Outpatient Medications on File Prior to Visit  Medication Sig Dispense Refill  . Calcium 500 MG CHEW Chew by mouth.    . Cholecalciferol (VITAMIN D-1000 MAX ST) 1000 units tablet Take by mouth.     No current facility-administered medications on file prior to visit.     Allergies  Allergen Reactions  . Penicillins     Past Medical History:  Diagnosis Date  . Arthritis     History reviewed. No pertinent surgical history.  Family History  Problem Relation Age of Onset  . Alcohol abuse Mother   . Alcohol abuse Father   . Diabetes Maternal Aunt   . Breast cancer Maternal Aunt   . Cancer Maternal Aunt        breast cancer    Social History   Socioeconomic History  . Marital status: Married    Spouse name: Not on file  . Number of children: 2  . Years of education: Not on file  . Highest education level: Not on file  Occupational History  . 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 Financial resource strain: Not on file  . Food insecurity:    Worry: Not on file    Inability: Not on file  . Transportation needs:    Medical: Not on file    Non-medical: Not on file  Tobacco Use  . Smoking status: Never Smoker  . Smokeless tobacco: Never Used  Substance and Sexual Activity  . Alcohol use: No  . Drug use: No  . Sexual activity: Not on file  Lifestyle  . Physical activity:    Days per week: Not on file    Minutes per session: Not on file  .  Stress: Not on file  Relationships  . Social connections:    Talks on phone: Not on file    Gets together: Not on file    Attends religious service: Not on file    Active member of club or organization: Not on file    Attends meetings of clubs or organizations: Not on file    Relationship status: Not on file  . Intimate partner violence:    Fear of current or ex partner: Not on file    Emotionally abused: Not on file    Physically abused: Not on file    Forced sexual activity: Not on file  Other Topics Concern  . Not on file  Social History Narrative   No living will or health care POA   Husband, then sons, would be decision makers   Would accept resuscitation   Not sure about tube feeds   Review of Systems Appetite is good Weight is stable Sleeps fairly well Wears seat belt Teeth are okay----overdue for dentist No skin rash Does have raised brown spot lateral to right eye (benign keratosis) Occasional heartburn-- tums helps. No swallowing problems Bowels fine--no blood No urinary symptoms No sig back or joint pain No chest pain No SOB No dizziness  or syncope occ sinus symptoms    Objective:   Physical Exam  Constitutional: She is oriented to person, place, and time. She appears well-developed and well-nourished. No distress.  HENT:  Mouth/Throat: Oropharynx is clear and moist. No oropharyngeal exudate.  Neck: No thyromegaly present.  Cardiovascular: Normal rate, regular rhythm, normal heart sounds and intact distal pulses. Exam reveals no gallop.  No murmur heard. Respiratory: Effort normal and breath sounds normal. No respiratory distress. She has no wheezes. She has no rales.  GI: Soft. There is no tenderness.  Musculoskeletal: She exhibits no edema or tenderness.  Lymphadenopathy:    She has no cervical adenopathy.  Neurological: She is alert and oriented to person, place, and time.  President-- "Trump, Obama, Bush" [phone]- "I'm not good at  math" D-l-r-o-w Recall 3/3  Skin: No rash noted. No erythema.  Psychiatric: She has a normal mood and affect. Her behavior is normal.           Assessment & Plan:

## 2017-12-02 NOTE — Assessment & Plan Note (Signed)
I have personally reviewed the Medicare Annual Wellness questionnaire and have noted 1. The patient's medical and social history 2. Their use of alcohol, tobacco or illicit drugs 3. Their current medications and supplements 4. The patient's functional ability including ADL's, fall risks, home safety risks and hearing or visual             impairment. 5. Diet and physical activities 6. Evidence for depression or mood disorders  The patients weight, height, BMI and visual acuity have been recorded in the chart I have made referrals, counseling and provided education to the patient based review of the above and I have provided the pt with a written personalized care plan for preventive services.  I have provided you with a copy of your personalized plan for preventive services. Please take the time to review along with your updated medication list.  mammo due 10/20 Colon just done--will reconsider in 2029 Pap normal in 2016 with negative HPV----will discuss one last one in 2021 Exercises Pneumovax Prefers to hold off on flu vaccine Will consider shingrix

## 2017-12-02 NOTE — Progress Notes (Signed)
Hearing Screening   Method: Audiometry   125Hz  250Hz  500Hz  1000Hz  2000Hz  3000Hz  4000Hz  6000Hz  8000Hz   Right ear:   25 20 20  20     Left ear:   20 20 20  20       Visual Acuity Screening   Right eye Left eye Both eyes  Without correction:     With correction: 20/40 20/40 20/40

## 2017-12-17 ENCOUNTER — Other Ambulatory Visit: Payer: Self-pay | Admitting: Internal Medicine

## 2017-12-17 DIAGNOSIS — Z1231 Encounter for screening mammogram for malignant neoplasm of breast: Secondary | ICD-10-CM

## 2018-01-16 ENCOUNTER — Ambulatory Visit
Admission: RE | Admit: 2018-01-16 | Discharge: 2018-01-16 | Disposition: A | Discharge status: Home / Self Care | Payer: Medicare Other | Source: Ambulatory Visit | Attending: Internal Medicine | Admitting: Internal Medicine

## 2018-01-16 DIAGNOSIS — Z1231 Encounter for screening mammogram for malignant neoplasm of breast: Secondary | ICD-10-CM | POA: Insufficient documentation

## 2018-12-10 ENCOUNTER — Encounter: Payer: Self-pay | Admitting: Internal Medicine

## 2018-12-10 ENCOUNTER — Other Ambulatory Visit: Payer: Self-pay

## 2018-12-10 ENCOUNTER — Ambulatory Visit (INDEPENDENT_AMBULATORY_CARE_PROVIDER_SITE_OTHER): Payer: Medicare Other | Admitting: Internal Medicine

## 2018-12-10 VITALS — BP 134/76 | HR 50 | Temp 98.2°F | Ht 60.5 in | Wt 118.0 lb

## 2018-12-10 DIAGNOSIS — Z7189 Other specified counseling: Secondary | ICD-10-CM | POA: Diagnosis not present

## 2018-12-10 DIAGNOSIS — Z0000 Encounter for general adult medical examination without abnormal findings: Secondary | ICD-10-CM | POA: Diagnosis not present

## 2018-12-10 DIAGNOSIS — M810 Age-related osteoporosis without current pathological fracture: Secondary | ICD-10-CM | POA: Diagnosis not present

## 2018-12-10 DIAGNOSIS — Z23 Encounter for immunization: Secondary | ICD-10-CM

## 2018-12-10 LAB — COMPREHENSIVE METABOLIC PANEL
ALT: 8 U/L (ref 0–35)
AST: 12 U/L (ref 0–37)
Albumin: 4.2 g/dL (ref 3.5–5.2)
Alkaline Phosphatase: 85 U/L (ref 39–117)
BUN: 17 mg/dL (ref 6–23)
CO2: 27 mEq/L (ref 19–32)
Calcium: 9.3 mg/dL (ref 8.4–10.5)
Chloride: 103 mEq/L (ref 96–112)
Creatinine, Ser: 0.78 mg/dL (ref 0.40–1.20)
GFR: 73.65 mL/min (ref >60.00)
Glucose, Bld: 94 mg/dL (ref 70–99)
Potassium: 4.1 mEq/L (ref 3.5–5.1)
Sodium: 140 mEq/L (ref 135–145)
Total Bilirubin: 0.9 mg/dL (ref 0.2–1.2)
Total Protein: 6.6 g/dL (ref 6.0–8.3)

## 2018-12-10 LAB — CBC
HCT: 39.2 % (ref 36.0–46.0)
Hemoglobin: 13.3 g/dL (ref 12.0–15.0)
MCHC: 33.9 g/dL (ref 30.0–36.0)
MCV: 90.5 fl (ref 78.0–100.0)
Platelets: 192 10*3/uL (ref 150.0–400.0)
RBC: 4.34 Mil/uL (ref 3.87–5.11)
RDW: 12.7 % (ref 11.5–15.5)
WBC: 6.4 10*3/uL (ref 4.0–10.5)

## 2018-12-10 LAB — T4, FREE: Free T4: 0.92 ng/dL (ref 0.60–1.60)

## 2018-12-10 NOTE — Assessment & Plan Note (Signed)
See social history 

## 2018-12-10 NOTE — Assessment & Plan Note (Signed)
In spine Osteopenia in hip Calcium, vitamin D, weight bearing exercise Plan to repeat DEXA in 2023 and start bisphosphonate then if worsening

## 2018-12-10 NOTE — Progress Notes (Signed)
Subjective:    Patient ID: Jill Yang, female    DOB: Aug 05, 1950, 68 y.o.   MRN: [id number]  HPI Here for Medicare wellness visit and follow up of chronic health conditions Reviewed form and advanced directives Reviewed other doctors No tobacco or alcohol Does walk regularly--discussed resistance training Vision and hearing are fine No falls No depression or anhedonia Independent with instrumental ADLs No memory problems  Has done okay with COVID Only goes out for grocery shopping  No sig back or joint pains---just typical aching, etc Continues on calcium and vitamin D  Current Outpatient Medications on File Prior to Visit  Medication Sig Dispense Refill  . Calcium 500 MG CHEW Chew by mouth.    . Cholecalciferol (VITAMIN D-1000 MAX ST) 1000 units tablet Take by mouth.     No current facility-administered medications on file prior to visit.     Allergies  Allergen Reactions  . Penicillins     Past Medical History:  Diagnosis Date  . Arthritis     History reviewed. No pertinent surgical history.  Family History  Problem Relation Age of Onset  . Alcohol abuse Mother   . Alcohol abuse Father   . Diabetes Maternal Aunt   . Breast cancer Maternal Aunt   . Cancer Maternal Aunt        breast cancer    Social History   Socioeconomic History  . Marital status: Married    Spouse name: Not on file  . Number of children: 2  . Years of education: Not on file  . Highest education level: Not on file  Occupational History  . 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 Financial resource strain: Not on file  . Food insecurity    Worry: Not on file    Inability: Not on file  . Transportation needs    Medical: Not on file    Non-medical: Not on file  Tobacco Use  . Smoking status: Never Smoker  . Smokeless tobacco: Never Used  Substance and Sexual Activity  . Alcohol use: No  . Drug use: No  .  Sexual activity: Not on file  Lifestyle  . Physical activity    Days per week: Not on file    Minutes per session: Not on file  . Stress: Not on file  Relationships  . Social Herbalist on phone: Not on file    Gets together: Not on file    Attends religious service: Not on file    Active member of club or organization: Not on file    Attends meetings of clubs or organizations: Not on file    Relationship status: Not on file  . Intimate partner violence    Fear of current or ex partner: Not on file    Emotionally abused: Not on file    Physically abused: Not on file    Forced sexual activity: Not on file  Other Topics Concern  . Not on file  Social History Narrative   No living will or health care POA   Husband, then sons, would be decision makers   Would accept resuscitation   Not sure about tube feeds   Review of Systems Appetite is good Weight down slightly--- she has been trying Sleeps well  Wears seat belt Teeth okay--just to dentist No rash or suspicious skin lesion No chest pain or SOB No dizziness or syncope No  edema Some sinus congestion--pollen related. benedryl helps--- discussed changing to claritin/zyrtec Occasional heartburn--depends on what she eats. Tums helps. No dysphagia Bowels are fine. No blood No urinary problems or incontinence    Objective:   Physical Exam  Constitutional: She is oriented to person, place, and time. She appears well-developed. No distress.  HENT:  Mouth/Throat: Oropharynx is clear and moist. No oropharyngeal exudate.  Neck: No thyromegaly present.  Cardiovascular: Normal rate, regular rhythm, normal heart sounds and intact distal pulses. Exam reveals no gallop.  No murmur heard. Respiratory: Effort normal and breath sounds normal. No respiratory distress. She has no wheezes. She has no rales.  GI: Soft. There is no abdominal tenderness.  Musculoskeletal:        General: No tenderness or edema.  Lymphadenopathy:     She has no cervical adenopathy.  Neurological: She is alert and oriented to person, place, and time.  President--- "[place], Bush" [phone] D-l-r-o-w Recall 3/3  Skin: No rash noted. No erythema.  Psychiatric: She has a normal mood and affect. Her behavior is normal.           Assessment & Plan:

## 2018-12-10 NOTE — Addendum Note (Signed)
Addended by: Pilar Grammes on: 12/10/2018 09:17 AM   Modules accepted: Orders

## 2018-12-10 NOTE — Assessment & Plan Note (Signed)
I have personally reviewed the Medicare Annual Wellness questionnaire and have noted 1. The patient's medical and social history 2. Their use of alcohol, tobacco or illicit drugs 3. Their current medications and supplements 4. The patient's functional ability including ADL's, fall risks, home safety risks and hearing or visual             impairment. 5. Diet and physical activities 6. Evidence for depression or mood disorders  The patients weight, height, BMI and visual acuity have been recorded in the chart I have made referrals, counseling and provided education to the patient based review of the above and I have provided the pt with a written personalized care plan for preventive services.  I have provided you with a copy of your personalized plan for preventive services. Please take the time to review along with your updated medication list.  Will give prevnar and flu vaccines Discussed considering shingrix at the pharmacy Colonoscopy normal in 2019 Mammogram every 1-2 years--probably getting again next month

## 2019-01-12 ENCOUNTER — Other Ambulatory Visit: Payer: Self-pay | Admitting: Internal Medicine

## 2019-01-12 DIAGNOSIS — Z1231 Encounter for screening mammogram for malignant neoplasm of breast: Secondary | ICD-10-CM

## 2019-03-31 ENCOUNTER — Ambulatory Visit
Admission: RE | Admit: 2019-03-31 | Discharge: 2019-03-31 | Disposition: A | Discharge status: Home / Self Care | Payer: Medicare Other | Source: Ambulatory Visit | Attending: Internal Medicine | Admitting: Internal Medicine

## 2019-03-31 DIAGNOSIS — Z1231 Encounter for screening mammogram for malignant neoplasm of breast: Secondary | ICD-10-CM | POA: Insufficient documentation

## 2019-04-02 ENCOUNTER — Encounter: Payer: Self-pay | Admitting: *Deleted

## 2019-12-16 ENCOUNTER — Encounter: Payer: Self-pay | Admitting: Internal Medicine

## 2019-12-16 ENCOUNTER — Other Ambulatory Visit: Payer: Self-pay

## 2019-12-16 ENCOUNTER — Ambulatory Visit (INDEPENDENT_AMBULATORY_CARE_PROVIDER_SITE_OTHER): Payer: Medicare Other | Admitting: Internal Medicine

## 2019-12-16 VITALS — BP 140/84 | HR 60 | Temp 97.5°F | Ht 60.75 in | Wt 121.0 lb

## 2019-12-16 DIAGNOSIS — Z0000 Encounter for general adult medical examination without abnormal findings: Secondary | ICD-10-CM

## 2019-12-16 DIAGNOSIS — M19041 Primary osteoarthritis, right hand: Secondary | ICD-10-CM | POA: Diagnosis not present

## 2019-12-16 DIAGNOSIS — Z23 Encounter for immunization: Secondary | ICD-10-CM

## 2019-12-16 DIAGNOSIS — M19042 Primary osteoarthritis, left hand: Secondary | ICD-10-CM

## 2019-12-16 DIAGNOSIS — M810 Age-related osteoporosis without current pathological fracture: Secondary | ICD-10-CM

## 2019-12-16 DIAGNOSIS — Z7189 Other specified counseling: Secondary | ICD-10-CM | POA: Diagnosis not present

## 2019-12-16 LAB — CBC
HCT: 39.9 % (ref 36.0–46.0)
Hemoglobin: 13.2 g/dL (ref 12.0–15.0)
MCHC: 33.1 g/dL (ref 30.0–36.0)
MCV: 91.2 fl (ref 78.0–100.0)
Platelets: 184 10*3/uL (ref 150.0–400.0)
RBC: 4.38 Mil/uL (ref 3.87–5.11)
RDW: 12.8 % (ref 11.5–15.5)
WBC: 6.3 10*3/uL (ref 4.0–10.5)

## 2019-12-16 LAB — COMPREHENSIVE METABOLIC PANEL
ALT: 9 U/L (ref 0–35)
AST: 13 U/L (ref 0–37)
Albumin: 4.4 g/dL (ref 3.5–5.2)
Alkaline Phosphatase: 81 U/L (ref 39–117)
BUN: 11 mg/dL (ref 6–23)
CO2: 30 mEq/L (ref 19–32)
Calcium: 9.2 mg/dL (ref 8.4–10.5)
Chloride: 105 mEq/L (ref 96–112)
Creatinine, Ser: 0.85 mg/dL (ref 0.40–1.20)
GFR: 66.49 mL/min (ref >60.00)
Glucose, Bld: 102 mg/dL — ABNORMAL HIGH (ref 70–99)
Potassium: 3.9 mEq/L (ref 3.5–5.1)
Sodium: 142 mEq/L (ref 135–145)
Total Bilirubin: 0.9 mg/dL (ref 0.2–1.2)
Total Protein: 6.9 g/dL (ref 6.0–8.3)

## 2019-12-16 NOTE — Assessment & Plan Note (Signed)
See social history Blank forms given 

## 2019-12-16 NOTE — Assessment & Plan Note (Signed)
I have personally reviewed the Medicare Annual Wellness questionnaire and have noted 1. The patient's medical and social history 2. Their use of alcohol, tobacco or illicit drugs 3. Their current medications and supplements 4. The patient's functional ability including ADL's, fall risks, home safety risks and hearing or visual             impairment. 5. Diet and physical activities 6. Evidence for depression or mood disorders  The patients weight, height, BMI and visual acuity have been recorded in the chart I have made referrals, counseling and provided education to the patient based review of the above and I have provided the pt with a written personalized care plan for preventive services.  I have provided you with a copy of your personalized plan for preventive services. Please take the time to review along with your updated medication list.  Healthy Stays active Flu vaccine today Mammogram yearly till at least 76 Consider last colon in 2029 COVID booster

## 2019-12-16 NOTE — Assessment & Plan Note (Signed)
Calcium, vitamin D, regular exercise Recheck DEXA in 2 years---start bisphosphonate then if worse

## 2019-12-16 NOTE — Progress Notes (Signed)
   Hearing Screening   Method: Audiometry   125Hz 250Hz 500Hz 1000Hz 2000Hz 3000Hz 4000Hz 6000Hz 8000Hz  Right ear:   20 20 20  20    Left ear:   20 20 20  20      Visual Acuity Screening   Right eye Left eye Both eyes  Without correction:     With correction: 20/25 20/25 20/25    

## 2019-12-16 NOTE — Assessment & Plan Note (Signed)
Better Would just use tylenol prn

## 2019-12-16 NOTE — Progress Notes (Signed)
Subjective:    Patient ID: Jill Yang, female    DOB: [birth year], 69 y.o.   MRN: [id number]  HPI Here for Medicare wellness visit and follow up of chronic health conditions This visit occurred during the SARS-CoV-2 public health emergency.  Safety protocols were in place, including screening questions prior to the visit, additional usage of staff PPE, and extensive cleaning of exam room while observing appropriate contact time as indicated for disinfecting solutions.   Reviewed form and advanced directives Reviewed other doctors No alcohol or tobacco Walks regularly and some resistance training Vision and hearing are fine No falls No depression or anhedonia Independent with instrumental ADLs No sig memory loss  No new concerns Continues on calcium and vitamin D Regular exercise  Hand pain is better since retiring--had worked in the cold Hasn't needed meds  Current Outpatient Medications on File Prior to Visit  Medication Sig Dispense Refill  . Calcium 500 MG CHEW Chew by mouth.    . Cholecalciferol (VITAMIN D-1000 MAX ST) 1000 units tablet Take by mouth.     No current facility-administered medications on file prior to visit.    Allergies  Allergen Reactions  . Penicillins     Past Medical History:  Diagnosis Date  . Arthritis     History reviewed. No pertinent surgical history.  Family History  Problem Relation Age of Onset  . Alcohol abuse Mother   . Alcohol abuse Father   . Diabetes Maternal Aunt   . Breast cancer Maternal Aunt   . Cancer Maternal Aunt        breast cancer    Social History   Socioeconomic History  . Marital status: Married    Spouse name: Not on file  . Number of children: 2  . Years of education: Not on file  . Highest education level: Not on file  Occupational History  . 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 Smoking status: Never Smoker  . Smokeless tobacco: Never Used    Substance and Sexual Activity  . Alcohol use: No  . Drug use: No  . Sexual activity: Not on file  Other Topics Concern  . Not on file  Social History Narrative   No living will or health care POA   Husband, then sons, would be decision makers   Would accept resuscitation   Not sure about tube feeds   Social Determinants of Health   Financial Resource Strain:   . Difficulty of Paying Living Expenses: Not on file  Food Insecurity:   . Worried About Programme researcher, broadcasting/film/video in the Last Year: Not on file  . Ran Out of Food in the Last Year: Not on file  Transportation Needs:   . Lack of Transportation (Medical): Not on file  . Lack of Transportation (Non-Medical): Not on file  Physical Activity:   . Days of Exercise per Week: Not on file  . Minutes of Exercise per Session: Not on file  Stress:   . Feeling of Stress : Not on file  Social Connections:   . Frequency of Communication with Friends and Family: Not on file  . Frequency of Social Gatherings with Friends and Family: Not on file  . Attends Religious Services: Not on file  . Active Member of Clubs or Organizations: Not on file  . Attends Banker Meetings: Not on file  . Marital Status: Not on file  Intimate Partner Violence:   .  Fear of Current or Ex-Partner: Not on file  . Emotionally Abused: Not on file  . Physically Abused: Not on file  . Sexually Abused: Not on file   Review of Systems Appetite is fine Weight is stable Sleeps well usually----husband's snoring does bother her at times Wears seat belt Teeth okay ---keeps up dentist No skin problems other than a brown spot on right face (not new) Occasional heartburn--tums will help. No dysphagia Bowels are fine--no blood No dysuria. No incontinence No chest pain or SOB No palpitations No dizziness or syncope    Objective:   Physical Exam Constitutional:      Appearance: Normal appearance.  HENT:     Mouth/Throat:     Comments: No oral  lesions Eyes:     Conjunctiva/sclera: Conjunctivae normal.     Pupils: Pupils are equal, round, and reactive to light.  Cardiovascular:     Rate and Rhythm: Normal rate and regular rhythm.     Pulses: Normal pulses.     Heart sounds: No murmur heard.  No gallop.   Pulmonary:     Effort: Pulmonary effort is normal.     Breath sounds: Normal breath sounds. No wheezing or rales.  Abdominal:     Palpations: Abdomen is soft.     Tenderness: There is no abdominal tenderness.  Musculoskeletal:        General: No swelling.     Cervical back: Neck supple.     Right lower leg: No edema.     Left lower leg: No edema.  Lymphadenopathy:     Cervical: No cervical adenopathy.  Skin:    Findings: No rash.     Comments: seb keratosis on right upper cheek  Neurological:     Mental Status: She is alert and oriented to person, place, and time.     Comments: Chase Caller, Obama" [phone] D-l-r-o-w Recall 3/3  Psychiatric:        Mood and Affect: Mood normal.        Behavior: Behavior normal.            Assessment & Plan:

## 2019-12-16 NOTE — Addendum Note (Signed)
Addended by: Eual Fines on: 12/16/2019 11:00 AM   Modules accepted: Orders

## 2020-02-24 ENCOUNTER — Other Ambulatory Visit: Payer: Self-pay | Admitting: Internal Medicine

## 2020-02-24 DIAGNOSIS — Z1231 Encounter for screening mammogram for malignant neoplasm of breast: Secondary | ICD-10-CM

## 2020-03-02 ENCOUNTER — Ambulatory Visit (INDEPENDENT_AMBULATORY_CARE_PROVIDER_SITE_OTHER): Payer: Medicare Other | Admitting: Internal Medicine

## 2020-03-02 ENCOUNTER — Other Ambulatory Visit: Payer: Self-pay

## 2020-03-02 ENCOUNTER — Encounter: Payer: Self-pay | Admitting: Internal Medicine

## 2020-03-02 DIAGNOSIS — M25551 Pain in right hip: Secondary | ICD-10-CM

## 2020-03-02 NOTE — Assessment & Plan Note (Signed)
Does not seem to be related to back or specifically the hip Likely soft tissue and may be due to leg length discrepancy Will set up with Dr Patsy Lager ?lift for shoes ?PT

## 2020-03-02 NOTE — Progress Notes (Signed)
   Subjective:    Patient ID: Jill Yang, female    DOB: Apr 22, 1950, 69 y.o.   MRN: [id number]  HPI Here due to right hip pain This visit occurred during the SARS-CoV-2 public health emergency.  Safety protocols were in place, including screening questions prior to the visit, additional usage of staff PPE, and extensive cleaning of exam room while observing appropriate contact time as indicated for disinfecting solutions.   Feels pain in base of right buttock and up posterior hip Sharp pain with sitting at times Will loosen up after walking for a while More pain in bed at times--if rolls over in bed a certain way Occasionally in upper thigh--but mostly hip No pain in groin Started about 4 weeks ago---intermittent (bad days/good days) No injury or new activiites  No leg weakness (but there is a leg length discrepancy) Has some inserts and changes inserts for arch support  Current Outpatient Medications on File Prior to Visit  Medication Sig Dispense Refill  . Calcium 500 MG CHEW Chew by mouth.    . Cholecalciferol 25 MCG (1000 UT) tablet Take by mouth.     No current facility-administered medications on file prior to visit.    Allergies  Allergen Reactions  . Penicillins     Past Medical History:  Diagnosis Date  . Arthritis     History reviewed. No pertinent surgical history.  Family History  Problem Relation Age of Onset  . Alcohol abuse Mother   . Alcohol abuse Father   . Diabetes Maternal Aunt   . Breast cancer Maternal Aunt   . Cancer Maternal Aunt        breast cancer    Social History   Socioeconomic History  . Marital status: Married    Spouse name: Not on file  . Number of children: 2  . Years of education: Not on file  . Highest education level: Not on file  Occupational History  . 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 Smoking status: Never Smoker  . Smokeless tobacco: Never Used  Substance  and Sexual Activity  . Alcohol use: No  . Drug use: No  . Sexual activity: Not on file  Other Topics Concern  . Not on file  Social History Narrative   No living will or health care POA   Husband, then sons, would be decision makers   Would accept resuscitation   Not sure about tube feeds   Social Determinants of Health   Financial Resource Strain: Not on file  Food Insecurity: Not on file  Transportation Needs: Not on file  Physical Activity: Not on file  Stress: Not on file  Social Connections: Not on file  Intimate Partner Violence: Not on file   Review of Systems  No other joint problems Has cut back on exercise due to this pain     Objective:   Physical Exam Musculoskeletal:     Comments: No spine tenderness Normal ROM in hips Mild leg length discrepancy---right shorter  Neurological:     Comments: Normal gait in shoes---antalgic favoring right leg in bare feet No leg weakness            Assessment & Plan:

## 2020-03-03 ENCOUNTER — Other Ambulatory Visit: Payer: Self-pay

## 2020-03-03 ENCOUNTER — Encounter: Payer: Self-pay | Admitting: Family Medicine

## 2020-03-03 ENCOUNTER — Ambulatory Visit: Payer: Medicare Other | Admitting: Family Medicine

## 2020-03-03 VITALS — BP 126/76 | HR 76 | Temp 97.7°F | Ht 60.75 in | Wt 122.1 lb

## 2020-03-03 DIAGNOSIS — M533 Sacrococcygeal disorders, not elsewhere classified: Secondary | ICD-10-CM

## 2020-03-03 DIAGNOSIS — M2170 Unequal limb length (acquired), unspecified site: Secondary | ICD-10-CM | POA: Diagnosis not present

## 2020-03-03 MED ORDER — PREDNISONE 20 MG PO TABS: ORAL_TABLET | ORAL | 0 refills | Status: DC

## 2020-03-03 NOTE — Progress Notes (Signed)
Jakobie Henslee T. Cassity Christian, MD, CAQ Sports Medicine  Primary Care and Sports Medicine [facility] [address] [place], [address]  Phone: [phone]  FAX: [phone]  KEIDRA WITHERS - 69 y.o. female  MRN [id number]  Date of Birth: Sep 09, 1950  Date: 03/03/2020  PCP: Karie Schwalbe, MD  Referral: Karie Schwalbe, MD  Chief Complaint  Patient presents with  . Hip Pain    R Hip pain X 4 weeks.     This visit occurred during the SARS-CoV-2 public health emergency.  Safety protocols were in place, including screening questions prior to the visit, additional usage of staff PPE, and extensive cleaning of exam room while observing appropriate contact time as indicated for disinfecting solutions.   Subjective:   Jill Yang is a 69 y.o. very pleasant female patient with Body mass index is 23.26 kg/m. who presents with the following:  She is a pleasant young 69 year old lady who is young for age.  She has been having some right-sided posterior buttocks pain as well as leg pain in the lowest aspect upper back for about 1 month.  She has had some pain in this region intermittently before, but it is flared up quite a bit.  The history is significant for a leg length discrepancy, and when she was 69 years old she walked on crutches for the entirety of 2 years.  She has not had any kind of specific injury or trauma that she can think of.  At baseline she is very active and she walks on her treadmill basically every day and she will go up and down from a 1% to 10% grade.  She is normally been able to do this without any difficulty.  R buttocks region.  Will walk on the treadmill.  Up and down 1-10 grade.   No injury.  Sometimes back will hurt.   69 yo had to walk on crutches for 2 years - R leg is a little shorter.   R leg is significantly  SI rehab chiro?  Review of Systems is noted in the HPI, as appropriate   Objective:   BP 126/76  (BP Location: Left Arm, Patient Position: Sitting)   Pulse 76   Temp 97.7 F (36.5 C) (Temporal)   Ht 5' 0.75" (1.543 m)   Wt 122 lb 1.6 oz (55.4 kg)   SpO2 98%   BMI 23.26 kg/m    Range of motion at  the waist: Flexion, extension, lateral bending and rotation:  full  No echymosis or edema Rises to examination table with mild difficulty Gait: minimally antalgic  Inspection/Deformity: N Paraspinus Tenderness: l5-s1 mostly on R  B Ankle Dorsiflexion (L5,4): 5/5 B Great Toe Dorsiflexion (L5,4): 5/5 Heel Walk (L5): WNL Toe Walk (S1): WNL Rise/Squat (L4): WNL, mild pain  SENSORY B Medial Foot (L4): WNL B Dorsum (L5): WNL B Lateral (S1): WNL Light Touch: WNL Pinprick: WNL  REFLEXES Knee (L4): 2+ Ankle (S1): 2+  B SLR, seated: neg B SLR, supine: neg B FABER: neg B Reverse FABER: neg B Greater Troch: NT B Log Roll: neg B Stork: NT B Sciatic Notch: notably tender at the R si joint  LL discrepancy is noted - approx 2 cm  Radiology: No results found.  Assessment and Plan:     ICD-10-CM   1. Sacroiliac joint dysfunction of right side  M53.3   2. Leg length discrepancy  M21.70    Back pain with history  of back pain with flareup.  Right now her SI joint is the primary area and focus of pain.  I think that the patient's leg length discrepancy directly influences this.  I placed him orthopedic felt insole correction with a heel elevation.  Hopefully this will help.  Also reviewed some basic hip and SI maneuvers.  She continues to have some difficulties then physical therapy is appropriate.  With SI joint dysfunction chiropractic manipulation also does work quite well.  Meds ordered this encounter  Medications  . predniSONE (DELTASONE) 20 MG tablet    Sig: 2 tabs po daily for 5 days, then 1 tab po daily for 5 days    Dispense:  15 tablet    Refill:  0    Signed,  Jasimine Simms T. Erique Kaser, MD   Outpatient Encounter Medications as of 03/03/2020  Medication Sig  .  Calcium 500 MG CHEW Chew by mouth.  . Cholecalciferol 25 MCG (1000 UT) tablet Take by mouth.  . predniSONE (DELTASONE) 20 MG tablet 2 tabs po daily for 5 days, then 1 tab po daily for 5 days   No facility-administered encounter medications on file as of 03/03/2020.

## 2020-03-04 ENCOUNTER — Encounter: Payer: Self-pay | Admitting: Family Medicine

## 2020-03-31 ENCOUNTER — Ambulatory Visit
Admission: RE | Admit: 2020-03-31 | Discharge: 2020-03-31 | Disposition: A | Discharge status: Home / Self Care | Payer: Medicare Other | Source: Ambulatory Visit | Attending: Internal Medicine | Admitting: Internal Medicine

## 2020-03-31 ENCOUNTER — Other Ambulatory Visit: Payer: Self-pay

## 2020-03-31 DIAGNOSIS — Z1231 Encounter for screening mammogram for malignant neoplasm of breast: Secondary | ICD-10-CM | POA: Insufficient documentation

## 2020-12-21 ENCOUNTER — Encounter: Payer: Medicare Other | Admitting: Internal Medicine

## 2020-12-21 ENCOUNTER — Encounter: Payer: Self-pay | Admitting: Internal Medicine

## 2020-12-21 ENCOUNTER — Ambulatory Visit (INDEPENDENT_AMBULATORY_CARE_PROVIDER_SITE_OTHER): Payer: Medicare Other | Admitting: Internal Medicine

## 2020-12-21 ENCOUNTER — Other Ambulatory Visit: Payer: Self-pay

## 2020-12-21 VITALS — BP 120/80 | HR 77 | Temp 97.5°F | Ht 60.25 in | Wt 119.0 lb

## 2020-12-21 DIAGNOSIS — Z7189 Other specified counseling: Secondary | ICD-10-CM

## 2020-12-21 DIAGNOSIS — M19042 Primary osteoarthritis, left hand: Secondary | ICD-10-CM | POA: Diagnosis not present

## 2020-12-21 DIAGNOSIS — Z23 Encounter for immunization: Secondary | ICD-10-CM

## 2020-12-21 DIAGNOSIS — M19041 Primary osteoarthritis, right hand: Secondary | ICD-10-CM

## 2020-12-21 DIAGNOSIS — Z0000 Encounter for general adult medical examination without abnormal findings: Secondary | ICD-10-CM

## 2020-12-21 DIAGNOSIS — M810 Age-related osteoporosis without current pathological fracture: Secondary | ICD-10-CM | POA: Diagnosis not present

## 2020-12-21 LAB — COMPREHENSIVE METABOLIC PANEL
ALT: 6 U/L (ref 0–35)
AST: 12 U/L (ref 0–37)
Albumin: 4.4 g/dL (ref 3.5–5.2)
Alkaline Phosphatase: 83 U/L (ref 39–117)
BUN: 15 mg/dL (ref 6–23)
CO2: 29 mEq/L (ref 19–32)
Calcium: 9.6 mg/dL (ref 8.4–10.5)
Chloride: 104 mEq/L (ref 96–112)
Creatinine, Ser: 0.78 mg/dL (ref 0.40–1.20)
GFR: 77.66 mL/min (ref >60.00)
Glucose, Bld: 101 mg/dL — ABNORMAL HIGH (ref 70–99)
Potassium: 4.3 mEq/L (ref 3.5–5.1)
Sodium: 141 mEq/L (ref 135–145)
Total Bilirubin: 1.2 mg/dL (ref 0.2–1.2)
Total Protein: 6.7 g/dL (ref 6.0–8.3)

## 2020-12-21 LAB — T4, FREE: Free T4: 0.89 ng/dL (ref 0.60–1.60)

## 2020-12-21 LAB — CBC
HCT: 40.6 % (ref 36.0–46.0)
Hemoglobin: 13.4 g/dL (ref 12.0–15.0)
MCHC: 33.1 g/dL (ref 30.0–36.0)
MCV: 90.8 fl (ref 78.0–100.0)
Platelets: 181 10*3/uL (ref 150.0–400.0)
RBC: 4.47 Mil/uL (ref 3.87–5.11)
RDW: 12.6 % (ref 11.5–15.5)
WBC: 6.3 10*3/uL (ref 4.0–10.5)

## 2020-12-21 NOTE — Progress Notes (Signed)
Subjective:    Patient ID: Jill Yang, female    DOB: Jul 17, 1950, 70 y.o.   MRN: [id number]  HPI Here for Medicare wellness visit and follow up of chronic health conditions This visit occurred during the SARS-CoV-2 public health emergency.  Safety protocols were in place, including screening questions prior to the visit, additional usage of staff PPE, and extensive cleaning of exam room while observing appropriate contact time as indicated for disinfecting solutions.   Reviewed advance directives Reviewed other doctors---[facility], Dr Dentist No hospitalizations or surgery No alcohol or tobacco Vision is okay Hearing is good No falls No depression or anhedonia Independent with instrumental ADLs No memory problems  No new concerns Regular exercise--still on calcium and vitamin D  Gets some hand pain at times Mostly in the cold weather No meds for this  Current Outpatient Medications on File Prior to Visit  Medication Sig Dispense Refill   Calcium 500 MG CHEW Chew by mouth.     Cholecalciferol 25 MCG (1000 UT) tablet Take by mouth.     No current facility-administered medications on file prior to visit.    Allergies  Allergen Reactions   Penicillins     Past Medical History:  Diagnosis Date   Arthritis     History reviewed. No pertinent surgical history.  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, sterilization    Comment: Retired  Tobacco Use   Smoking status: Never   Smokeless tobacco: Never  Substance and Sexual Activity   Alcohol  use: No   Drug use: No   Sexual activity: Not on file  Other Topics Concern   Not on file  Social History Narrative   No living will or health care POA   Husband, then sons, would be decision makers   Would accept resuscitation   Not sure about tube feeds   Social Determinants of Health   Financial Resource Strain: Not on file  Food Insecurity: Not on file  Transportation Needs: Not on file  Physical Activity: Not on file  Stress: Not on file  Social Connections: Not on file  Intimate Partner Violence: Not on file   Review of Systems Appetite is good Weight is stable Sleeps well Teeth are fine---keeps up with dentist No suspicious skin lesions Rare heartburn--if eats the wrong thing. Tums helps Bowels are fine---no blood No dysuria, hematuria or incontinence No chest pain or SOB No palpitations No dizziness or syncope No edema    Objective:   Physical Exam Constitutional:      Appearance: Normal appearance.  HENT:     Mouth/Throat:     Comments: No lesions Eyes:     Conjunctiva/sclera: Conjunctivae normal.     Pupils: Pupils are equal, round, and reactive to light.  Cardiovascular:     Rate and Rhythm: Normal rate and regular rhythm.     Pulses: Normal pulses.     Heart sounds: No murmur heard.  No gallop.  Pulmonary:     Effort: Pulmonary effort is normal.     Breath sounds: Normal breath sounds. No wheezing or rales.  Abdominal:     Palpations: Abdomen is soft.     Tenderness: There is no abdominal tenderness.  Musculoskeletal:     Cervical back: Neck supple.     Right lower leg: No edema.     Left lower leg: No edema.  Lymphadenopathy:     Cervical: No cervical adenopathy.  Skin:    Findings: No lesion or rash.  Neurological:     Mental Status: She is alert and oriented to person, place, and time.     Comments: President---"Biden, Trump, Clinton----Bush---?" 100-93- "not good at math" D-l-r-o-w Recall 3/3  Psychiatric:        Mood and Affect:  Mood normal.        Behavior: Behavior normal.           Assessment & Plan:

## 2020-12-21 NOTE — Assessment & Plan Note (Signed)
See social history Blank forms given 

## 2020-12-21 NOTE — Assessment & Plan Note (Signed)
I have personally reviewed the Medicare Annual Wellness questionnaire and have noted 1. The patient's medical and social history 2. Their use of alcohol, tobacco or illicit drugs 3. Their current medications and supplements 4. The patient's functional ability including ADL's, fall risks, home safety risks and hearing or visual             impairment. 5. Diet and physical activities 6. Evidence for depression or mood disorders  The patients weight, height, BMI and visual acuity have been recorded in the chart I have made referrals, counseling and provided education to the patient based review of the above and I have provided the pt with a written personalized care plan for preventive services.  I have provided you with a copy of your personalized plan for preventive services. Please take the time to review along with your updated medication list.  Stays fit Flu vaccine today Bivalent COVID at pharmacy shingrix when covered Consider last colonoscopy 2029 Yearly mammogram--due January

## 2020-12-21 NOTE — Assessment & Plan Note (Signed)
Just in spine Does regular exercise Calcium and vitamin D Will repeat DEXA next year---if sig worse, would start bisphosphonate

## 2020-12-21 NOTE — Assessment & Plan Note (Signed)
Better Hasn't needed meds

## 2020-12-21 NOTE — Progress Notes (Signed)
Hearing Screening  Method: Audiometry   500Hz 1000Hz 2000Hz 4000Hz  Right ear 20 20 20 20  Left ear 20 20 20 20   Vision Screening   Right eye Left eye Both eyes  Without correction     With correction 20/25 20/25 20/25    

## 2021-01-03 ENCOUNTER — Other Ambulatory Visit: Payer: Self-pay | Admitting: Internal Medicine

## 2021-01-03 DIAGNOSIS — Z1231 Encounter for screening mammogram for malignant neoplasm of breast: Secondary | ICD-10-CM

## 2021-04-11 ENCOUNTER — Ambulatory Visit
Admission: RE | Admit: 2021-04-11 | Discharge: 2021-04-11 | Disposition: A | Discharge status: Home / Self Care | Payer: Medicare Other | Source: Ambulatory Visit | Attending: Internal Medicine | Admitting: Internal Medicine

## 2021-04-11 ENCOUNTER — Other Ambulatory Visit: Payer: Self-pay

## 2021-04-11 DIAGNOSIS — Z1231 Encounter for screening mammogram for malignant neoplasm of breast: Secondary | ICD-10-CM | POA: Insufficient documentation

## 2021-12-22 ENCOUNTER — Encounter: Payer: Self-pay | Admitting: Internal Medicine

## 2021-12-22 ENCOUNTER — Ambulatory Visit (INDEPENDENT_AMBULATORY_CARE_PROVIDER_SITE_OTHER): Payer: Medicare Other | Admitting: Internal Medicine

## 2021-12-22 VITALS — BP 128/80 | HR 60 | Temp 97.2°F | Ht 60.5 in | Wt 116.0 lb

## 2021-12-22 DIAGNOSIS — M19042 Primary osteoarthritis, left hand: Secondary | ICD-10-CM

## 2021-12-22 DIAGNOSIS — K219 Gastro-esophageal reflux disease without esophagitis: Secondary | ICD-10-CM | POA: Diagnosis not present

## 2021-12-22 DIAGNOSIS — M19041 Primary osteoarthritis, right hand: Secondary | ICD-10-CM | POA: Diagnosis not present

## 2021-12-22 DIAGNOSIS — M810 Age-related osteoporosis without current pathological fracture: Secondary | ICD-10-CM | POA: Diagnosis not present

## 2021-12-22 DIAGNOSIS — Z0000 Encounter for general adult medical examination without abnormal findings: Secondary | ICD-10-CM

## 2021-12-22 DIAGNOSIS — Z7189 Other specified counseling: Secondary | ICD-10-CM | POA: Diagnosis not present

## 2021-12-22 DIAGNOSIS — Z23 Encounter for immunization: Secondary | ICD-10-CM

## 2021-12-22 LAB — COMPREHENSIVE METABOLIC PANEL
ALT: 7 U/L (ref 0–35)
AST: 11 U/L (ref 0–37)
Albumin: 4.4 g/dL (ref 3.5–5.2)
Alkaline Phosphatase: 78 U/L (ref 39–117)
BUN: 14 mg/dL (ref 6–23)
CO2: 29 mEq/L (ref 19–32)
Calcium: 9.1 mg/dL (ref 8.4–10.5)
Chloride: 105 mEq/L (ref 96–112)
Creatinine, Ser: 0.81 mg/dL (ref 0.40–1.20)
GFR: 73.7 mL/min (ref >60.00)
Glucose, Bld: 106 mg/dL — ABNORMAL HIGH (ref 70–99)
Potassium: 4.2 mEq/L (ref 3.5–5.1)
Sodium: 141 mEq/L (ref 135–145)
Total Bilirubin: 1 mg/dL (ref 0.2–1.2)
Total Protein: 6.8 g/dL (ref 6.0–8.3)

## 2021-12-22 LAB — CBC
HCT: 39 % (ref 36.0–46.0)
Hemoglobin: 13.2 g/dL (ref 12.0–15.0)
MCHC: 33.8 g/dL (ref 30.0–36.0)
MCV: 90.3 fl (ref 78.0–100.0)
Platelets: 183 10*3/uL (ref 150.0–400.0)
RBC: 4.32 Mil/uL (ref 3.87–5.11)
RDW: 12.8 % (ref 11.5–15.5)
WBC: 7.1 10*3/uL (ref 4.0–10.5)

## 2021-12-22 NOTE — Progress Notes (Signed)
Subjective:    Patient ID: Jill Yang, female    DOB: 11-13-1950, 71 y.o.   MRN: [id number]  HPI Here for Medicare wellness visit and follow up of chronic health conditions Reviewed advanced directives Reviewed other doctors---[facility], Dr Dentist No hospitalizations or surgery in the past year Vision is fine--due for exam Hearing is good No alcohol or tobacco Regular exercise--but has dropped off. Treadmill and resistance No falls No depression or anhedonia Independent with instrumental ADLs No memory problems  Known osteoporosis Is on calcium and vitamin D Regular exercise  Mild hand arthritis Uses aleve or OTC cream (does use diclofenac) Wears arthritis gloves  Current Outpatient Medications on File Prior to Visit  Medication Sig Dispense Refill   Calcium 500 MG CHEW Chew by mouth.     Cholecalciferol 25 MCG (1000 UT) tablet Take by mouth.     No current facility-administered medications on file prior to visit.    Allergies  Allergen Reactions   Penicillins     Past Medical History:  Diagnosis Date   Arthritis     History reviewed. No pertinent surgical history.  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, sterilization    Comment: Retired  Tobacco Use   Smoking status: Never    Passive exposure: Past   Smokeless tobacco: Never  Substance and Sexual Activity   Alcohol use: No   Drug use: No   Sexual activity: Not on file  Other Topics Concern   Not on file  Social History Narrative   No living will or health care POA    Husband, then sons, would be decision makers   Would accept resuscitation   Not sure about tube feeds--but not if cognitively unaware   Social Determinants of Health   Financial Resource Strain: Not on file  Food Insecurity: Not on file  Transportation Needs: Not on file  Physical Activity: Not on file  Stress: Not on file  Social Connections: Not on file  Intimate Partner Violence: Not on file   Review of Systems Appetite is good Weight is stable Sleeps okay usually Wears seat belt Teeth okay--keeps up with dentist No suspicious skin lesions--some chronic stable moles on back No chest pain or SOB No syncope or dizziness Rare vertigo rolling over in bed No edema Occ heartburn--tums helps. No dysphagia Bowels are fine--no blood No urinary symptoms or incontinence    Objective:   Physical Exam Constitutional:      Appearance: Normal appearance.  HENT:     Mouth/Throat:     Comments: No lesions Eyes:     Conjunctiva/sclera: Conjunctivae normal.     Pupils: Pupils are equal, round, and reactive to light.  Cardiovascular:     Rate and Rhythm: Normal rate and regular rhythm.     Pulses: Normal pulses.     Heart sounds: No murmur heard.    No gallop.  Pulmonary:     Effort: Pulmonary effort  is normal.     Breath sounds: Normal breath sounds. No wheezing or rales.  Abdominal:     Palpations: Abdomen is soft.     Tenderness: There is no abdominal tenderness.  Musculoskeletal:     Cervical back: Neck supple.     Right lower leg: No edema.     Left lower leg: No edema.  Lymphadenopathy:     Cervical: No cervical adenopathy.  Skin:    Findings: No lesion or rash.  Neurological:     General: No focal deficit present.     Mental Status: She is alert and oriented to person, place, and time.     Comments: Mini-cog-- clock okay but minute hand wrong Recall 2/3  Psychiatric:        Mood and Affect: Mood normal.        Behavior: Behavior normal.             Assessment & Plan:

## 2021-12-22 NOTE — Assessment & Plan Note (Signed)
See social history 

## 2021-12-22 NOTE — Progress Notes (Signed)
Vision Screening   Right eye Left eye Both eyes  Without correction     With correction 20/25 20/25 20/25  Hearing Screening - Comments:: Passed whisper test  

## 2021-12-22 NOTE — Assessment & Plan Note (Signed)
I have personally reviewed the Medicare Annual Wellness questionnaire and have noted 1. The patient's medical and social history 2. Their use of alcohol, tobacco or illicit drugs 3. Their current medications and supplements 4. The patient's functional ability including ADL's, fall risks, home safety risks and hearing or visual             impairment. 5. Diet and physical activities 6. Evidence for depression or mood disorders  The patients weight, height, BMI and visual acuity have been recorded in the chart I have made referrals, counseling and provided education to the patient based review of the above and I have provided the pt with a written personalized care plan for preventive services.  I have provided you with a copy of your personalized plan for preventive services. Please take the time to review along with your updated medication list.  Last colon in 2029 Yearly mammograms till at least 75 Done with paps Regular exercise Updated COVID and shingrix at pharmacy Flu vaccine today

## 2021-12-22 NOTE — Addendum Note (Signed)
Addended by: Pilar Grammes on: 12/22/2021 10:48 AM   Modules accepted: Orders

## 2021-12-22 NOTE — Assessment & Plan Note (Signed)
In spine Osteopenia in hip Will recheck DEXA---if sig worsening, will start actonel Continue calcium and vitamin D

## 2021-12-22 NOTE — Assessment & Plan Note (Signed)
Does okay with OTC topical diclofenac, gloves, etc

## 2021-12-22 NOTE — Assessment & Plan Note (Signed)
Mild Uses tums Suggested famotidine 20 before inciting foods

## 2022-01-22 ENCOUNTER — Other Ambulatory Visit: Payer: Self-pay | Admitting: Internal Medicine

## 2022-01-22 DIAGNOSIS — Z1231 Encounter for screening mammogram for malignant neoplasm of breast: Secondary | ICD-10-CM

## 2022-04-12 ENCOUNTER — Ambulatory Visit
Admission: RE | Admit: 2022-04-12 | Discharge: 2022-04-12 | Disposition: A | Discharge status: Home / Self Care | Payer: Medicare Other | Source: Ambulatory Visit | Attending: Internal Medicine | Admitting: Internal Medicine

## 2022-04-12 DIAGNOSIS — Z1231 Encounter for screening mammogram for malignant neoplasm of breast: Secondary | ICD-10-CM | POA: Diagnosis not present

## 2022-04-12 DIAGNOSIS — M810 Age-related osteoporosis without current pathological fracture: Secondary | ICD-10-CM | POA: Insufficient documentation

## 2022-04-16 ENCOUNTER — Encounter: Payer: Self-pay | Admitting: *Deleted

## 2022-04-24 ENCOUNTER — Telehealth: Payer: Self-pay

## 2022-04-24 NOTE — Telephone Encounter (Signed)
Left message for pt to call office

## 2022-04-24 NOTE — Telephone Encounter (Signed)
-----   Message from Venia Carbon, MD sent at 04/12/2022 12:55 PM EST ----- Please call her The bone density has declined some in her spine--but not really in her hip I think it makes sense to start the weekly or monthly medications for this If she is okay with it, I can send the prescription. Okay to set up appointment if she wants to discuss it more

## 2022-04-25 NOTE — Telephone Encounter (Signed)
[place] Night - Client Nonclinical Telephone Record  [facility] Client [facility] Night - Client Client Site [name] Who Is Calling Patient / Member / Family / Caregiver Caller Name Jill Yang Caller Phone Number [phone] Call Type Message Only Information Provided Reason for Call Returning a Call from the Office Initial [place] states that she is trying to get in touch with Jill Yang. She is returning a call to her. Additional Comment Caller would like a message sent over requesting another call back. Disp. Time Disposition Final User 04/25/2022 7:56:05 AM General Information Provided Yes [name] Call Closed By: [name] Transaction Date/Time: 04/25/2022 7:54:22 AM (ET

## 2022-04-25 NOTE — Telephone Encounter (Signed)
Spoke to pt. She said she would like to try a monthly medication. She is aware it will be tomorrow before it gets sent in.

## 2022-04-26 MED ORDER — RISEDRONATE SODIUM 150 MG PO TABS: 150.0000 mg | ORAL_TABLET | ORAL | 3 refills | Status: DC

## 2022-04-26 NOTE — Telephone Encounter (Signed)
Spoke to pt. Gave her the directions. She asked about side effects. I advised that if she has anything out of her ordinary after 1-2 doses, let us know.

## 2022-04-26 NOTE — Telephone Encounter (Signed)
Please let her know I sent the Rx. Make sure she knows the instructions about empty stomach, big glass of water and staying upright for at least 30 minutes after taking

## 2022-04-26 NOTE — Addendum Note (Signed)
Addended by: Viviana Simpler I on: 04/26/2022 07:43 AM   Modules accepted: Orders

## 2022-12-25 ENCOUNTER — Ambulatory Visit: Payer: Medicare Other | Admitting: Internal Medicine

## 2022-12-25 ENCOUNTER — Encounter: Payer: Self-pay | Admitting: Internal Medicine

## 2022-12-25 VITALS — BP 130/84 | HR 75 | Temp 98.3°F | Ht 60.25 in | Wt 119.0 lb

## 2022-12-25 DIAGNOSIS — M19042 Primary osteoarthritis, left hand: Secondary | ICD-10-CM | POA: Diagnosis not present

## 2022-12-25 DIAGNOSIS — Z0000 Encounter for general adult medical examination without abnormal findings: Secondary | ICD-10-CM | POA: Diagnosis not present

## 2022-12-25 DIAGNOSIS — M19041 Primary osteoarthritis, right hand: Secondary | ICD-10-CM | POA: Diagnosis not present

## 2022-12-25 DIAGNOSIS — Z1159 Encounter for screening for other viral diseases: Secondary | ICD-10-CM | POA: Diagnosis not present

## 2022-12-25 DIAGNOSIS — Z23 Encounter for immunization: Secondary | ICD-10-CM | POA: Diagnosis not present

## 2022-12-25 DIAGNOSIS — K219 Gastro-esophageal reflux disease without esophagitis: Secondary | ICD-10-CM

## 2022-12-25 DIAGNOSIS — M810 Age-related osteoporosis without current pathological fracture: Secondary | ICD-10-CM | POA: Diagnosis not present

## 2022-12-25 LAB — CBC
HCT: 40.9 % (ref 36.0–46.0)
Hemoglobin: 13.4 g/dL (ref 12.0–15.0)
MCHC: 32.7 g/dL (ref 30.0–36.0)
MCV: 91.7 fL (ref 78.0–100.0)
Platelets: 198 10*3/uL (ref 150.0–400.0)
RBC: 4.46 Mil/uL (ref 3.87–5.11)
RDW: 13.5 % (ref 11.5–15.5)
WBC: 6 10*3/uL (ref 4.0–10.5)

## 2022-12-25 LAB — COMPREHENSIVE METABOLIC PANEL
ALT: 8 U/L (ref 0–35)
AST: 14 U/L (ref 0–37)
Albumin: 4.4 g/dL (ref 3.5–5.2)
Alkaline Phosphatase: 73 U/L (ref 39–117)
BUN: 14 mg/dL (ref 6–23)
CO2: 27 meq/L (ref 19–32)
Calcium: 9 mg/dL (ref 8.4–10.5)
Chloride: 106 meq/L (ref 96–112)
Creatinine, Ser: 0.71 mg/dL (ref 0.40–1.20)
GFR: 85.72 mL/min (ref >60.00)
Glucose, Bld: 105 mg/dL — ABNORMAL HIGH (ref 70–99)
Potassium: 4.5 meq/L (ref 3.5–5.1)
Sodium: 141 meq/L (ref 135–145)
Total Bilirubin: 1.1 mg/dL (ref 0.2–1.2)
Total Protein: 6.8 g/dL (ref 6.0–8.3)

## 2022-12-25 LAB — TSH: TSH: 1.19 u[IU]/mL (ref 0.35–5.50)

## 2022-12-25 LAB — VITAMIN D 25 HYDROXY (VIT D DEFICIENCY, FRACTURES): VITD: 32.82 ng/mL (ref 30.00–100.00)

## 2022-12-25 NOTE — Assessment & Plan Note (Signed)
Does well with the arthritis gloves--and occ topical diclofenac

## 2022-12-25 NOTE — Assessment & Plan Note (Signed)
Doing well with risendronate 150 monthly--Rx till 2028 Vitamin D exercise

## 2022-12-25 NOTE — Assessment & Plan Note (Signed)
Mild Uses pepcid AC prn

## 2022-12-25 NOTE — Progress Notes (Signed)
Subjective:    Patient ID: Jill Yang, female    DOB: 10-Feb-1951, 72 y.o.   MRN: [id number]  HPI Here for Medicare wellness visit and follow up of chronic health conditions Reviewed advanced directives Reviewed other doctors---[facility]--[facility]to, [facility]-- Dentist No hospitalizations or surgery in the past year Does walk and do some exercises--has slacked off some Vision is okay Hearing is fine No alcohol or tobacco No falls No sig depression or anhedonia Independent with instrumental ADLs No sig memory issues  Having stress [place]--though not severe (mostly outdoor damage)  No problems with the actonel No GI problems  Heartburn is quiet Uses pepcid AC prn No dysphagia  Ongoing hand arthritis Had been better last winter Uses arthritis gloves--and diclofenac topical prn  Current Outpatient Medications on File Prior to Visit  Medication Sig Dispense Refill   Calcium 500 MG CHEW Chew by mouth.     Cholecalciferol 25 MCG (1000 UT) tablet Take by mouth.     risedronate (ACTONEL) 150 MG tablet Take 1 tablet (150 mg total) by mouth every 30 (thirty) days. with water on empty stomach, nothing by mouth or lie down for next 30 minutes. 3 tablet 3   No current facility-administered medications on file prior to visit.    Allergies  Allergen Reactions   Penicillins     Past Medical History:  Diagnosis Date   Arthritis     History reviewed. No pertinent surgical history.  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,  sterilization    Comment: Retired  Tobacco Use   Smoking status: Never    Passive exposure: Past   Smokeless tobacco: Never  Substance and Sexual Activity   Alcohol use: No   Drug use: No   Sexual activity: Not on file  Other Topics Concern   Not on file  Social History Narrative   No living will or health care POA   Husband, then sons, would be decision makers   Would accept resuscitation   Not sure about tube feeds--but not if cognitively unaware   Social Determinants of Health   Financial Resource Strain: Not on file  Food Insecurity: Not on file  Transportation Needs: Not on file  Physical Activity: Not on file  Stress: Not on file  Social Connections: Not on file  Intimate Partner Violence: Not on file   Review of Systems Appetite is good Weight about the same Sleeps okay Wars seat belt Teeth are fine--keeps up with dentist No suspicious skin lesions No chest pain or SOB No dizziness or syncope No edema No palpitations Bowels move fine--no blood Voids okay---no incontinence    Objective:   Physical Exam Constitutional:      Appearance: Normal appearance.  HENT:     Mouth/Throat:     Pharynx: No oropharyngeal exudate or posterior oropharyngeal erythema.  Eyes:     Conjunctiva/sclera: Conjunctivae normal.  Pupils: Pupils are equal, round, and reactive to light.  Cardiovascular:     Rate and Rhythm: Normal rate and regular rhythm.     Pulses: Normal pulses.     Heart sounds: No murmur heard.    No gallop.  Pulmonary:     Effort: Pulmonary effort is normal.     Breath sounds: Normal breath sounds. No wheezing or rales.  Abdominal:     Palpations: Abdomen is soft.     Tenderness: There is no abdominal tenderness.  Musculoskeletal:     Cervical back: Neck supple.     Right lower leg: No edema.     Left lower leg: No edema.  Lymphadenopathy:     Cervical: No cervical adenopathy.  Skin:    Findings: No rash.  Neurological:     General: No focal  deficit present.     Mental Status: She is alert and oriented to person, place, and time.     Comments: Word naming---8/1 minute Recall 3/3  Psychiatric:        Mood and Affect: Mood normal.        Behavior: Behavior normal.            Assessment & Plan:

## 2022-12-25 NOTE — Assessment & Plan Note (Signed)
I have personally reviewed the Medicare Annual Wellness questionnaire and have noted 1. The patient's medical and social history 2. Their use of alcohol, tobacco or illicit drugs 3. Their current medications and supplements 4. The patient's functional ability including ADL's, fall risks, home safety risks and hearing or visual             impairment. 5. Diet and physical activities 6. Evidence for depression or mood disorders  The patients weight, height, BMI and visual acuity have been recorded in the chart I have made referrals, counseling and provided education to the patient based review of the above and I have provided the pt with a written personalized care plan for preventive services.  I have provided you with a copy of your personalized plan for preventive services. Please take the time to review along with your updated medication list.  Last screening colon 2029 Mammograms yearly or every other year till 1 --or likely 80 Discussed exercise--getting back to regular Flu vaccine today COVID update at pharmacy---and consider shingrix

## 2022-12-26 LAB — HEPATITIS C ANTIBODY: Hepatitis C Ab: NONREACTIVE

## 2023-01-17 ENCOUNTER — Other Ambulatory Visit: Payer: Self-pay | Admitting: Internal Medicine

## 2023-01-17 DIAGNOSIS — Z1231 Encounter for screening mammogram for malignant neoplasm of breast: Secondary | ICD-10-CM

## 2023-04-16 ENCOUNTER — Ambulatory Visit
Admission: RE | Admit: 2023-04-16 | Discharge: 2023-04-16 | Disposition: A | Discharge status: Home / Self Care | Payer: Medicare Other | Source: Ambulatory Visit | Attending: Internal Medicine | Admitting: Internal Medicine

## 2023-04-16 DIAGNOSIS — Z1231 Encounter for screening mammogram for malignant neoplasm of breast: Secondary | ICD-10-CM | POA: Insufficient documentation

## 2023-04-26 ENCOUNTER — Other Ambulatory Visit: Payer: Self-pay | Admitting: Internal Medicine

## 2023-06-13 IMAGING — MG MM DIGITAL SCREENING BILAT W/ TOMO AND CAD
6 of 10 series · 6 of 30 positions shown · non-contrast
Comparison: Previous exam(s).

CLINICAL DATA: Screening.

EXAM:
DIGITAL SCREENING BILATERAL MAMMOGRAM WITH TOMOSYNTHESIS AND CAD
TECHNIQUE: Bilateral screening digital craniocaudal and mediolateral oblique
mammograms were obtained. Bilateral screening digital breast
tomosynthesis was performed. The images were evaluated with
computer-aided detection.

[L CC synth-2D]
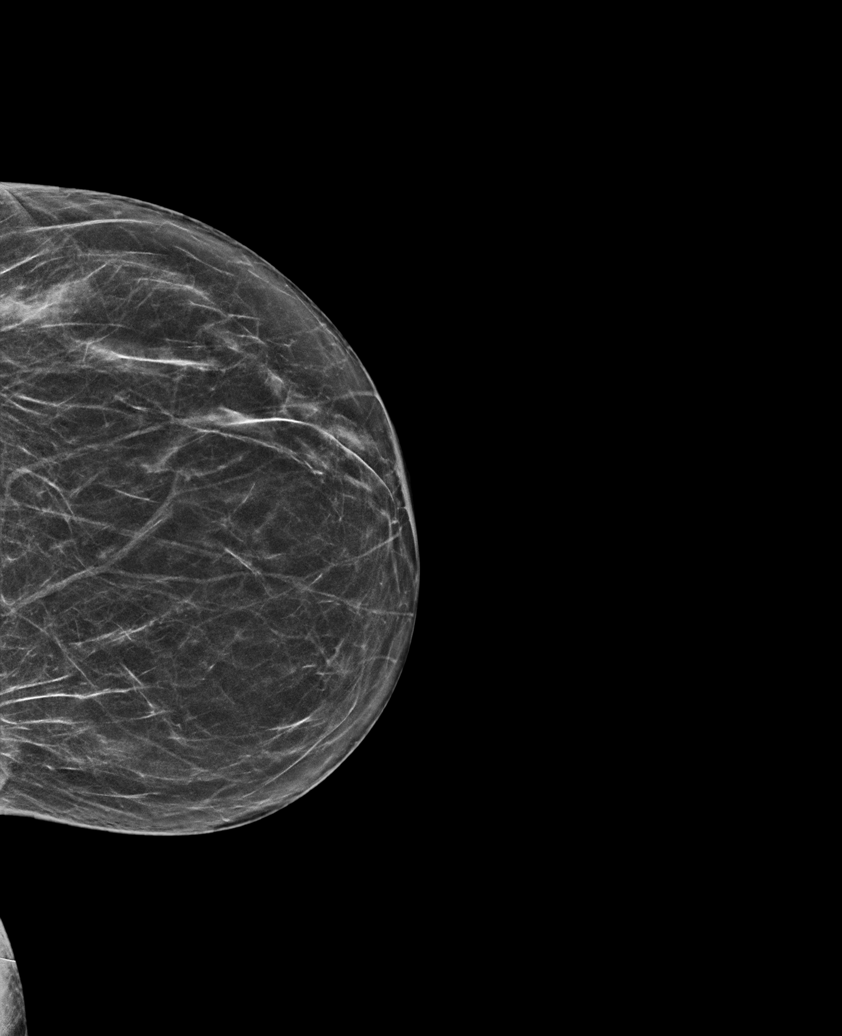

[L MLO synth-2D (1 of 2)]
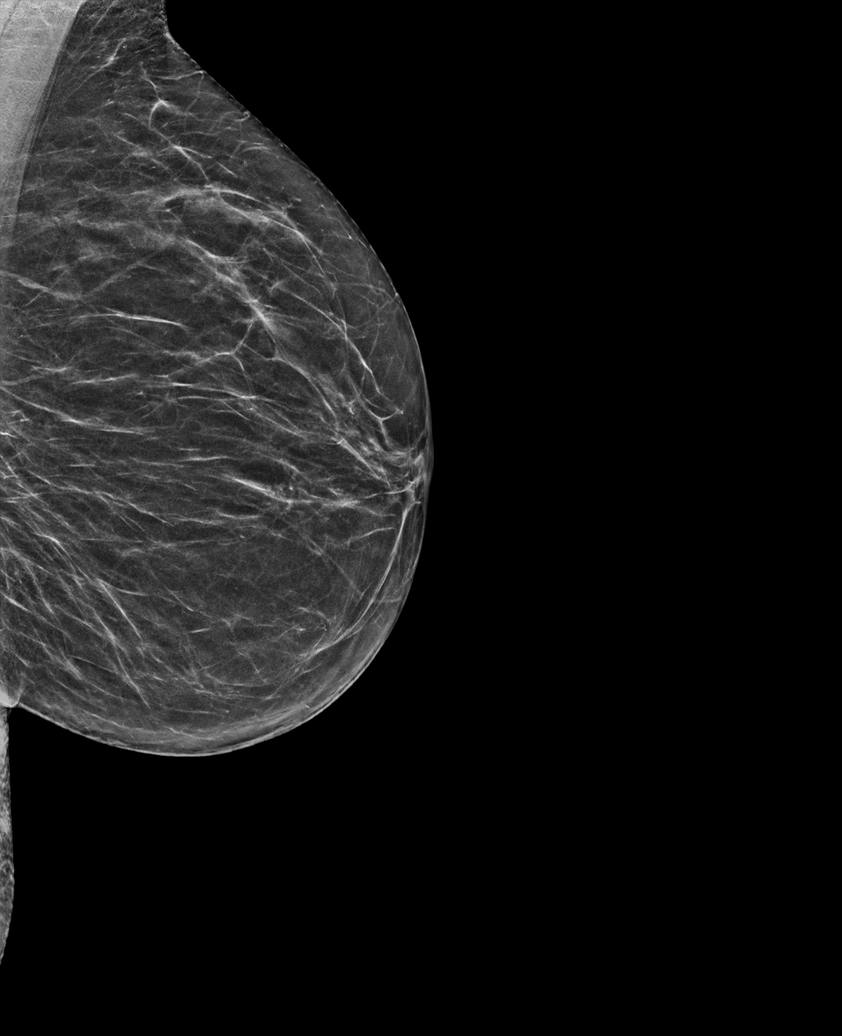

[L MLO synth-2D (2 of 2)]
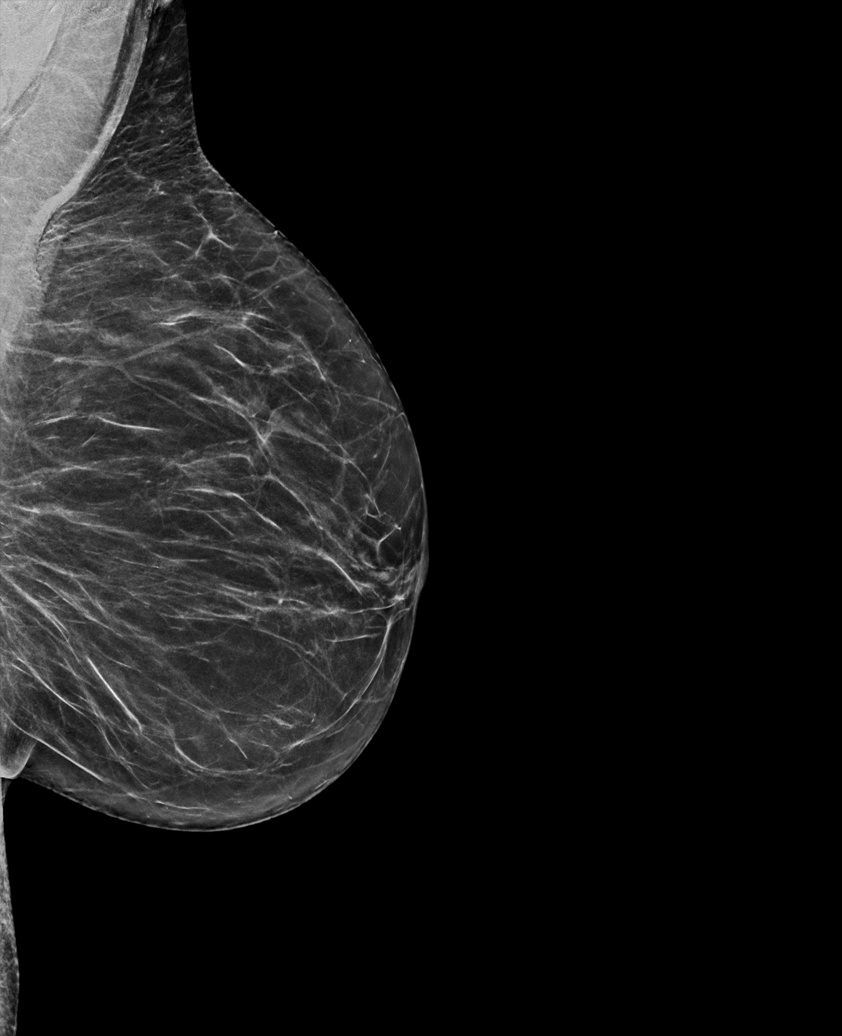

[R CC synth-2D]
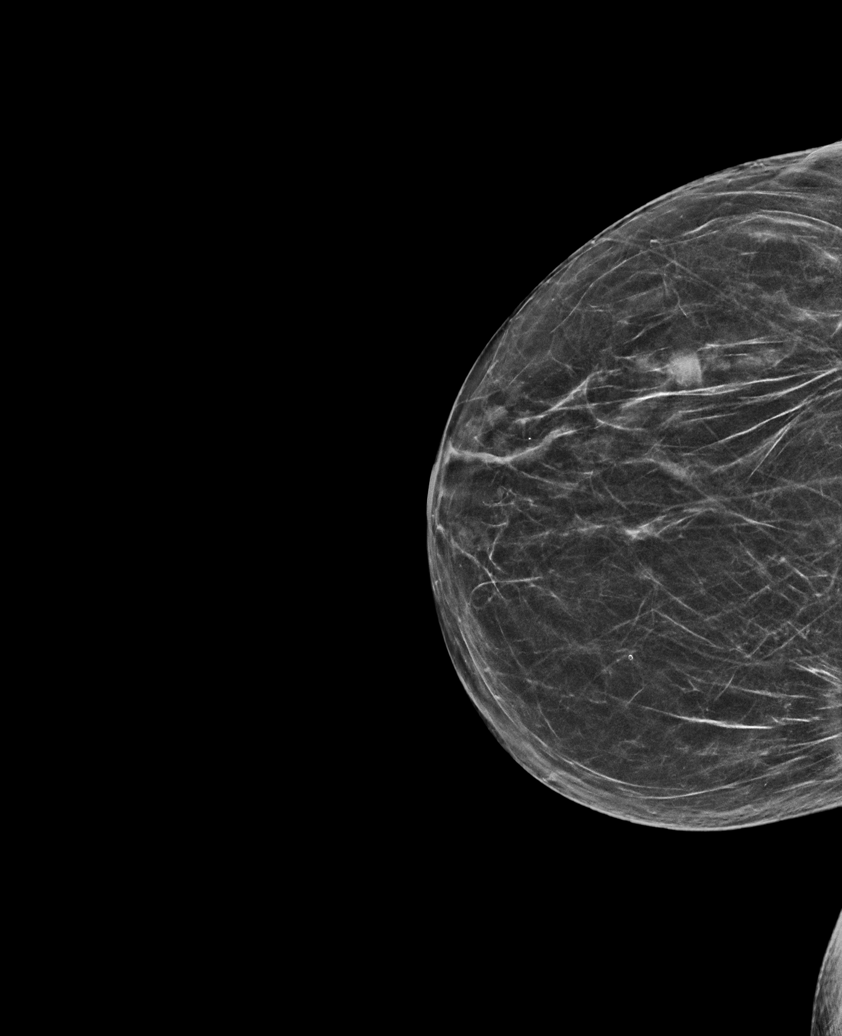

[R MLO synth-2D]
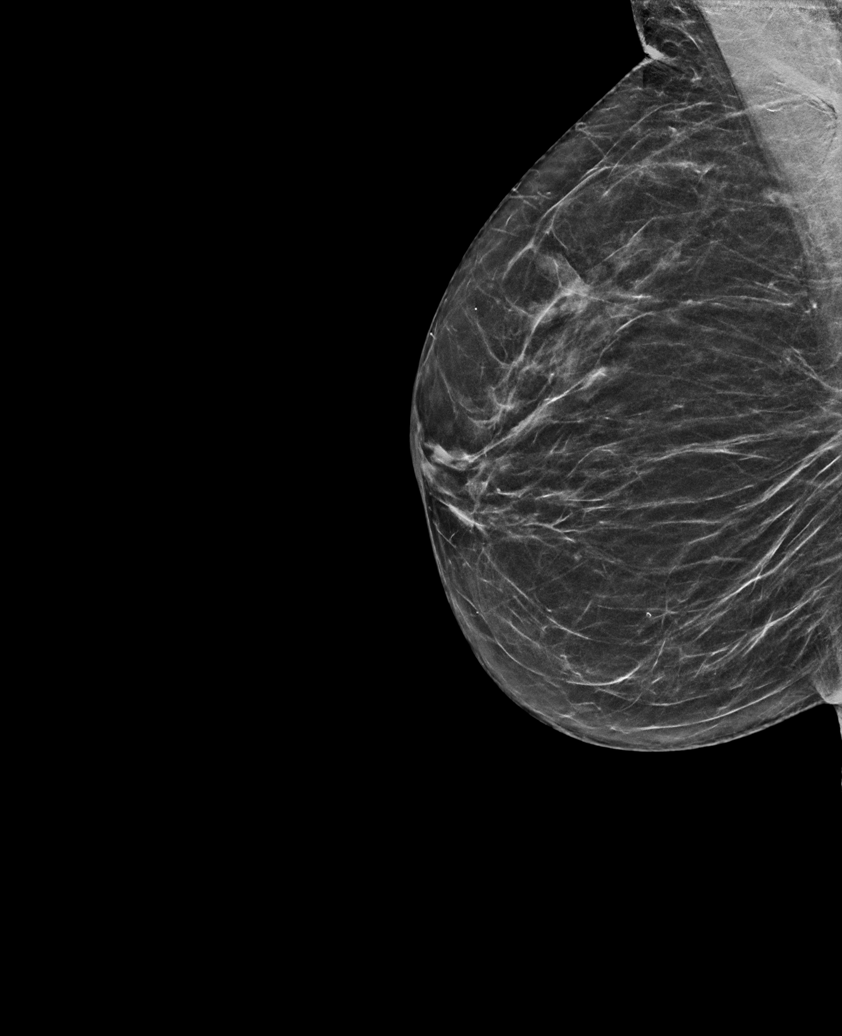

[R CC tomo · tomo slice 27/53.0]
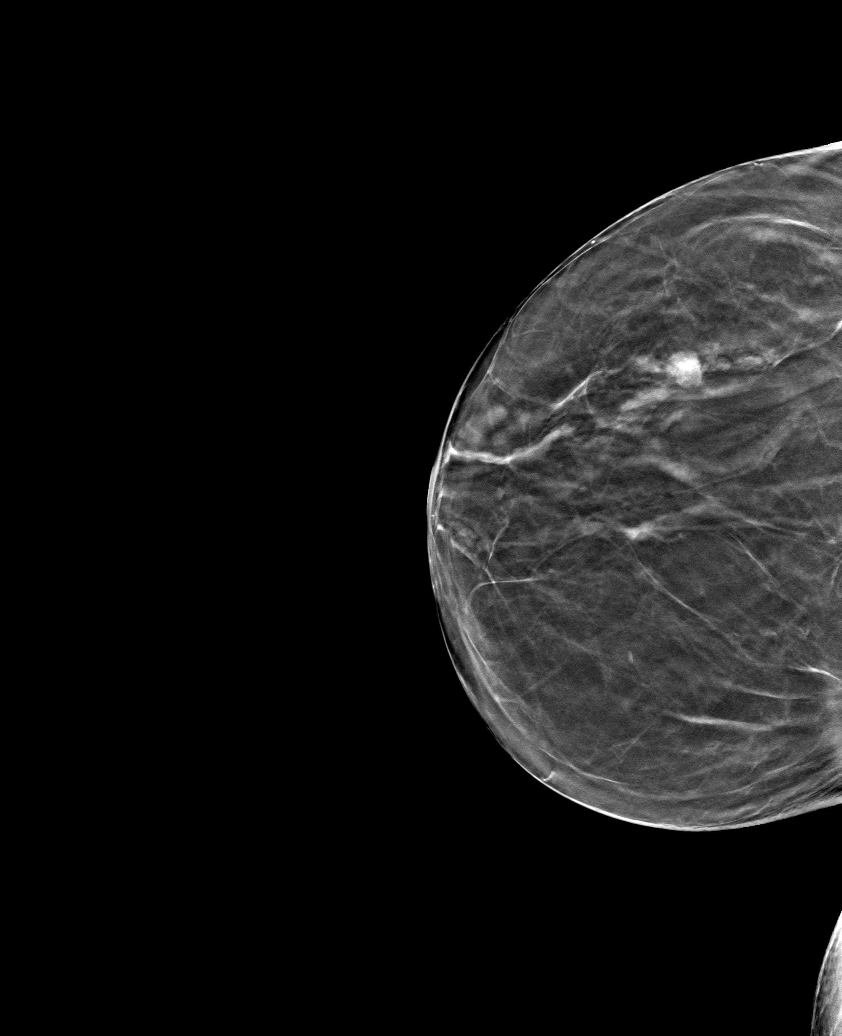

[6 of 30 positions shown; findings below may reference images not displayed]

ACR Breast Density Category b: There are scattered areas of
fibroglandular density.
FINDINGS: There are no findings suspicious for malignancy.
IMPRESSION: No mammographic evidence of malignancy. A result letter of this
screening mammogram will be mailed directly to the patient.

RECOMMENDATION:
Screening mammogram in one year. (Code:51-O-LD2)

BI-RADS CATEGORY  1: Negative.

## 2023-11-25 ENCOUNTER — Telehealth: Payer: Self-pay

## 2023-11-25 NOTE — Telephone Encounter (Signed)
 Copied from CRM [id number]. Topic: Appointments - Transfer of Care >> Nov 25, 2023 11:16 AM Henretta I wrote: Pt is requesting to transfer FROM: Dr. Charlie Denise  Pt is requesting to transfer TO: Dr. Reuben Burkes Reason for requested transfer: Dr.Letvak retired  It is the responsibility of the team the patient would like to transfer to (Dr. Reuben Burkes) to reach out to the patient if for any reason this transfer is not acceptable.

## 2023-12-31 ENCOUNTER — Ambulatory Visit (INDEPENDENT_AMBULATORY_CARE_PROVIDER_SITE_OTHER)

## 2023-12-31 ENCOUNTER — Ambulatory Visit: Payer: Self-pay

## 2023-12-31 VITALS — BP 122/74 | HR 66 | Temp 97.7°F | Ht 60.0 in | Wt 120.0 lb

## 2023-12-31 DIAGNOSIS — M810 Age-related osteoporosis without current pathological fracture: Secondary | ICD-10-CM | POA: Diagnosis not present

## 2023-12-31 DIAGNOSIS — Z136 Encounter for screening for cardiovascular disorders: Secondary | ICD-10-CM

## 2023-12-31 DIAGNOSIS — Z23 Encounter for immunization: Secondary | ICD-10-CM | POA: Diagnosis not present

## 2023-12-31 LAB — LIPID PANEL
Cholesterol: 246 mg/dL — ABNORMAL HIGH (ref 0–200)
HDL: 62 mg/dL (ref >39.00)
LDL Cholesterol: 157 mg/dL — ABNORMAL HIGH (ref 0–99)
NonHDL: 184.06
Total CHOL/HDL Ratio: 4
Triglycerides: 134 mg/dL (ref 0.0–149.0)
VLDL: 26.8 mg/dL (ref 0.0–40.0)

## 2023-12-31 LAB — VITAMIN D 25 HYDROXY (VIT D DEFICIENCY, FRACTURES): VITD: 26.78 ng/mL — ABNORMAL LOW (ref 30.00–100.00)

## 2023-12-31 LAB — COMPREHENSIVE METABOLIC PANEL WITH GFR
ALT: 8 U/L (ref 0–35)
AST: 12 U/L (ref 0–37)
Albumin: 4.5 g/dL (ref 3.5–5.2)
Alkaline Phosphatase: 86 U/L (ref 39–117)
BUN: 15 mg/dL (ref 6–23)
CO2: 29 meq/L (ref 19–32)
Calcium: 9.1 mg/dL (ref 8.4–10.5)
Chloride: 102 meq/L (ref 96–112)
Creatinine, Ser: 0.75 mg/dL (ref 0.40–1.20)
GFR: 79.69 mL/min (ref >60.00)
Glucose, Bld: 109 mg/dL — ABNORMAL HIGH (ref 70–99)
Potassium: 4.2 meq/L (ref 3.5–5.1)
Sodium: 140 meq/L (ref 135–145)
Total Bilirubin: 1 mg/dL (ref 0.2–1.2)
Total Protein: 7.1 g/dL (ref 6.0–8.3)

## 2023-12-31 NOTE — Progress Notes (Signed)
 Subjective:   This visit was conducted in person. The patient gave informed consent to the use of Abridge AI technology to record the contents of the encounter as documented below.   Patient ID: Jill Yang, female    DOB: 07-22-1950, 73 y.o.   MRN: [id number]  History of Present Illness Jill Yang is a 73 year old female who presents for a new patient visit to review her medical history and current treatments.  She has osteoporosis and discontinued Actonel  in May 2025. She reported experiencing unbearable acid reflux while taking Actonel . She continues to take calcium and vitamin D  supplements, with one capsule of vitamin D  (1000 units) daily and one chewable calcium tablet (500 mg) daily. She engages in regular exercise, primarily walking on a treadmill at home, but has reduced her activity due to foot pain from plantar fasciitis. Her diet includes dairy products like yogurt, and she focuses on consuming fruits, vegetables, chicken, and pork.  She experiences acid reflux, which has improved since discontinuing Actonel . She has not been taking any specific medication for reflux since stopping Actonel .  Her vaccination history includes a recent flu shot, but she has not received the COVID booster for the current season or the shingles vaccine.    Review of Systems      Allergies  Allergen Reactions   Actonel  [Risedronate ] Other (See Comments)    Severe acid reflux   Penicillins     Current Outpatient Medications on File Prior to Visit  Medication Sig Dispense Refill   Calcium 500 MG CHEW Chew 2 each by mouth daily.     Cholecalciferol 25 MCG (1000 UT) tablet Take by mouth.     No current facility-administered medications on file prior to visit.    BP 122/74 (BP Location: Left Arm, Patient Position: Sitting, Cuff Size: Normal)   Pulse 66   Temp 97.7 F (36.5 C) (Oral)   Ht 5' (1.524 m)   Wt 120 lb (54.4 kg)   SpO2 99%   BMI 23.44 kg/m   Objective:       Physical Exam CONSTITUTIONAL: Alert, NAD HEENT: Normocephalic and atraumatic, extraocular movements are intact, conjunctivae show no abnormalities SKIN: Skin is warm NEUOROLOGICAL: Oriented to person, place and time PSYCHIATRIC: No abnormalities in mood or affect        Assessment & Plan:   Assessment and Plan Assessment & Plan Osteoporosis Osteoporosis management with calcium and vitamin D  supplementation. She stopped Actonel  in May due to severe reflux. Alternative treatments, including Fosamax and injectable bone medications, were discussed. She prefers to continue without bisphosphonates or any osteoporosis medications at this time.  Extensively counseled patient about her fracture risk while untreated, she verbalizes understanding. Potential complications of fractures, including hospitalizations and surgeries, were discussed. Ordered vitamin D  level for monitoring, as well as CMP to rule out undiagnosed CKD as a potential contributing factor.  - Increase calcium intake to 1000 mg daily by chewing two tablets - Continue vitamin D  supplementation at 1000 units daily - Encourage regular weight-bearing exercise, including treadmill walking with incline - Order vitamin D  level test - Discuss alternative osteoporosis medications at next visit - CMP  Need for influenza vaccination Discussed immunizations including flu shot, COVID booster, and shingles vaccine. She declined COVID booster and shingles vaccine. Explained the benefits and common side effects of the shingles vaccine, including soreness, redness, bruising, and swelling. Emphasized the importance of shingles vaccine in preventing painful rash and potential chronic nerve pain.  She  will ask about it at pharmacy.  - Administered flu shot - Encourage consideration of shingles vaccine  Screening for cardiovascular condition Patient due for upcoming physical in a few weeks, see age-appropriate labs ordered below.  - Lipid  panel   Return in about 3 weeks (around 01/21/2024) for CPE.   Tinsley Lomas K Everlie Eble, MD  12/31/23     Contains text generated by Abridge.

## 2023-12-31 NOTE — Patient Instructions (Addendum)
 Thank you for visiting [facility] today! Here's what we talked about: - Get Shingles from pharmacy -Increase calcium to 2 capsules daily, continue vitamin D  - Continue exercises

## 2024-01-06 ENCOUNTER — Telehealth: Payer: Self-pay

## 2024-01-06 DIAGNOSIS — E785 Hyperlipidemia, unspecified: Secondary | ICD-10-CM

## 2024-01-06 MED ORDER — ATORVASTATIN CALCIUM 20 MG PO TABS
20.0000 mg | ORAL_TABLET | Freq: Every day | ORAL | 3 refills | Status: DC
Start: 2024-01-06 — End: 2024-01-06

## 2024-01-06 MED ORDER — ATORVASTATIN CALCIUM 20 MG PO TABS
20.0000 mg | ORAL_TABLET | Freq: Every day | ORAL | 1 refills | Status: AC
Start: 2024-01-06 — End: 2024-07-04

## 2024-01-06 NOTE — Telephone Encounter (Signed)
 Pt agreed to starting a new cholesterol medication per recent lab results.

## 2024-01-06 NOTE — Telephone Encounter (Signed)
Sent Lipitor

## 2024-01-06 NOTE — Telephone Encounter (Signed)
 Copied from CRM #[id number]. Topic: Clinical - Medication Question >> Jan 06, 2024  9:47 AM Jill Yang wrote: Reason for CRM: Patient calling in regards to cholesterol medication that was supposed to be called into her pharmacy, but has not been, patient is not sure what the name of the medication is, patient's preferred pharmacy is  [id number] - [place], [place] - [address] [place] [address] Phone: [phone] Fax: [phone]

## 2024-02-05 ENCOUNTER — Ambulatory Visit (INDEPENDENT_AMBULATORY_CARE_PROVIDER_SITE_OTHER)

## 2024-02-05 VITALS — BP 124/86 | HR 58 | Temp 97.8°F | Ht 60.0 in | Wt 119.0 lb

## 2024-02-05 DIAGNOSIS — M810 Age-related osteoporosis without current pathological fracture: Secondary | ICD-10-CM | POA: Diagnosis not present

## 2024-02-05 DIAGNOSIS — Z131 Encounter for screening for diabetes mellitus: Secondary | ICD-10-CM

## 2024-02-05 DIAGNOSIS — Z0000 Encounter for general adult medical examination without abnormal findings: Secondary | ICD-10-CM

## 2024-02-05 LAB — HEMOGLOBIN A1C: Hgb A1c MFr Bld: 5.6 % (ref 4.6–6.5)

## 2024-02-05 NOTE — Progress Notes (Signed)
 Assessment & Plan:   This visit was conducted in person. The patient gave informed consent to the use of Abridge AI technology to record the contents of the encounter as documented below.  Assessment & Plan Adult Wellness Visit Routine wellness visit with immunizations up to date except shingles vaccine. Discussed advance directives and healthcare power of attorney.  I have personally reviewed and have noted: 1. The patient's medical and social history 2. Their use of alcohol, tobacco or illicit drugs 3. Their current medications and supplements 4. The patient's functional ability including ADL's, fall risks, home safety risks and hearing or visual impairment. 5. Diet and physical activities 6. Evidence for depression or mood disorder  Colonoscopy: Reportedly done in 2019, awaiting ROR from [facility] Mammogram: Done in 03/27/2023 WNL, routine screening recommended DEXA: Done on 1/25 2024 showed osteoporosis of the lumbar spine, discussed with patient previously, declines bisphosphonate treatment even at this time, continues calcium and vitamin D  supplementation with weightbearing exercises. Pap:  last done in 2016, HPV negative with normal cytology, adequate screening to discontinue Labs: Hep C, Lipid and A1c UTD Immunizations: Shingles from pharmacy, declines covid Diet and Exercise: No concerns Sleep and mood: No concerns Dental and vision:Needs eye appt ADLs and IADLs: Capable Cognitive: Intact to orientation, naming, recall and repetition Fall risk and home safety: No concerns  - Scheduled follow-up in one year for physical exam. - Ordered fasting labs one week prior to next year's physical. - Encouraged obtaining shingles vaccine from pharmacy. - Encouraged setting up eye appointment.  Osteoporosis Prefers non-pharmacological management with supplements and exercises.  - Continue calcium 1000 mg oral daily. - Continue cholecalciferol 25 mcg oral daily. - Encouraged  continuation of weight-bearing exercises.  Hyperlipidemia Managed with atorvastatin. Cholesterol levels monitored. - Continue atorvastatin 20 mg oral daily.  Screening for diabetes mellitus Screening indicated due to lack of previous A1c testing. No suspicion of diabetes, baseline measurement recommended. - Ordered A1c test.   Follow-up: Return in about 1 year (around 02/04/2025) for CPE, fasting labs 1 week prior.        Subjective:   Patient ID: Jill Yang, female    DOB: 02-Aug-1950  Age: 73 y.o. MRN: [id number]  Patient Care Team: Bennett Reuben POUR, MD as PCP - General (Family Medicine)   CC:  Chief Complaint  Patient presents with   Annual Exam     Jill Yang is a 73 y.o. female who presents today for a complete physical exam.   Discussed the use of AI scribe software for clinical note transcription with the patient, who gave verbal consent to proceed.  History of Present Illness Jill Yang is a 73 year old female who presents for an annual physical exam.  She has no current complaints and maintains her health with regular screenings and preventive measures. Her last colonoscopy in 2019 and mammogram in January of this year were normal. Her last Pap smear was in 2016 and the results were normal. She is up to date with her immunizations, including a tetanus shot due in 2026, a pneumonia shot in 2020, and a flu shot in October of this year. She plans to get a shingles vaccine from her pharmacy after the recommended waiting period post-flu shot.  She has osteoporosis diagnosed in her spine and manages it with calcium and vitamin D  supplements. She takes two tablets of calcium and two tablets of vitamin D  daily due to a low vitamin D  level noted a month ago.  She engages in weight-bearing exercises by walking on a treadmill with an incline while wearing a weighted vest.  No issues with sleep or anxiety. She has regular dental check-ups every six months and  acknowledges the need for an eye exam, which she has not had in the past two years. She lives with her husband and is independent in her daily activities, including housework and yard work. No falls in the past three months and no issues with mobility or using assistive devices.  She has regular bowel movements, typically daily, and attributes this to a diet rich in fruits and fiber. She drinks plenty of water and reports no issues with urination.   Advanced Directives Full code, Patient does not have advanced directives, will think about it    Depression Screening;    02/05/2024    9:32 AM 12/31/2023    9:28 AM 12/25/2022    9:33 AM 12/25/2022    8:53 AM 12/22/2021    9:31 AM 12/21/2020   10:22 AM 12/16/2019    9:01 AM  PHQ 2/9 Scores  PHQ - 2 Score 0 0 0 0 0 0 0     Anxiety Screening:     No data to display           ROS: Negative unless specifically indicated above in HPI.       Objective:     BP 124/86 (BP Location: Left Arm, Patient Position: Sitting, Cuff Size: Normal)   Pulse (!) 58   Temp 97.8 F (36.6 C) (Oral)   Ht 5' (1.524 m)   Wt 119 lb (54 kg)   SpO2 98%   BMI 23.24 kg/m    Physical Exam GENERAL: Alert, cooperative, well developed, no acute distress. HEAD: Normocephalic atraumatic. EYES: Extraocular movements intact bilaterally, pupils round, equal and reactive to light bilaterally, conjunctivae normal bilaterally. EARS: Cerumen present, more on R side. Tympanic membrane, ear canal and external ear normal on L side. NOSE: No congestion or rhinorrhea, mucous membranes are moist. THROAT: No oropharyngeal exudate or posterior oropharyngeal erythema. CARDIOVASCULAR: Normal heart rate and rhythm, S1 and S2 normal without murmurs. CHEST: Clear to auscultation bilaterally, no wheezes, rhonchi, or crackles. ABDOMEN: Soft, non-tender, non-distended, without organomegaly, normal bowel sounds. EXTREMITIES: No cyanosis or edema. NEUROLOGICAL: Oriented to  person, place and time, no gait abnormalities, moves all extremities without gross motor or sensory deficit, cranial nerves grossly intact. NECK: Neck supple, no tenderness, thyroid  non-tender.        Deneka Greenwalt K Nayan Proch, MD  02/05/24    Contains text generated by [employer].

## 2024-02-05 NOTE — Patient Instructions (Addendum)
 Thank you for visiting [facility] today! Here's what we talked about: - Get shingles from pharmacy, set up eye appointment

## 2024-02-06 ENCOUNTER — Ambulatory Visit: Payer: Self-pay

## 2024-03-30 ENCOUNTER — Other Ambulatory Visit: Payer: Self-pay

## 2024-03-30 DIAGNOSIS — Z1231 Encounter for screening mammogram for malignant neoplasm of breast: Secondary | ICD-10-CM

## 2024-04-16 ENCOUNTER — Ambulatory Visit
Admission: RE | Admit: 2024-04-16 | Discharge: 2024-04-16 | Disposition: A | Discharge status: Home / Self Care | Source: Ambulatory Visit

## 2024-04-16 DIAGNOSIS — Z1231 Encounter for screening mammogram for malignant neoplasm of breast: Secondary | ICD-10-CM | POA: Diagnosis present

## 2025-01-29 ENCOUNTER — Other Ambulatory Visit

## 2025-02-05 ENCOUNTER — Encounter
